# Patient Record
Sex: Female | Born: 1960 | Race: Black or African American | Hispanic: No | Marital: Married | Smoking: Former smoker
Health system: Southern US, Community
[De-identification: ages and names within clinical notes are randomized; demographics above are authoritative.]

## PROBLEM LIST (undated history)

## (undated) DIAGNOSIS — E119 Type 2 diabetes mellitus without complications: Secondary | ICD-10-CM

## (undated) DIAGNOSIS — E785 Hyperlipidemia, unspecified: Secondary | ICD-10-CM

## (undated) DIAGNOSIS — R011 Cardiac murmur, unspecified: Secondary | ICD-10-CM

## (undated) DIAGNOSIS — M1990 Unspecified osteoarthritis, unspecified site: Secondary | ICD-10-CM

## (undated) DIAGNOSIS — G4730 Sleep apnea, unspecified: Secondary | ICD-10-CM

## (undated) DIAGNOSIS — G589 Mononeuropathy, unspecified: Secondary | ICD-10-CM

## (undated) DIAGNOSIS — R87619 Unspecified abnormal cytological findings in specimens from cervix uteri: Secondary | ICD-10-CM

## (undated) DIAGNOSIS — IMO0002 Reserved for concepts with insufficient information to code with codable children: Secondary | ICD-10-CM

## (undated) DIAGNOSIS — Z87898 Personal history of other specified conditions: Secondary | ICD-10-CM

## (undated) HISTORY — DX: Hyperlipidemia, unspecified: E78.5

## (undated) HISTORY — PX: HIP SURGERY: SHX245

## (undated) HISTORY — PX: OTHER SURGICAL HISTORY: SHX169

## (undated) HISTORY — DX: Type 2 diabetes mellitus without complications: E11.9

## (undated) HISTORY — DX: Unspecified abnormal cytological findings in specimens from cervix uteri: R87.619

## (undated) HISTORY — PX: LAPAROSCOPIC GASTRIC BANDING: SHX1100

## (undated) HISTORY — DX: Reserved for concepts with insufficient information to code with codable children: IMO0002

## (undated) HISTORY — DX: Personal history of other specified conditions: Z87.898

---
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
## 1981-11-15 HISTORY — PX: TONSILLECTOMY: SUR1361

## 1998-02-13 ENCOUNTER — Ambulatory Visit (HOSPITAL_COMMUNITY): Admission: RE | Admit: 1998-02-13 | Discharge: 1998-02-13 | Payer: Self-pay | Admitting: Family Medicine

## 1998-02-18 ENCOUNTER — Other Ambulatory Visit: Admission: RE | Admit: 1998-02-18 | Discharge: 1998-02-18 | Payer: Self-pay | Admitting: Obstetrics & Gynecology

## 1998-08-15 ENCOUNTER — Other Ambulatory Visit: Admission: RE | Admit: 1998-08-15 | Discharge: 1998-08-15 | Payer: Self-pay | Admitting: Obstetrics & Gynecology

## 1999-03-15 ENCOUNTER — Inpatient Hospital Stay (HOSPITAL_COMMUNITY): Admission: AD | Admit: 1999-03-15 | Discharge: 1999-03-15 | Payer: Self-pay | Admitting: Obstetrics and Gynecology

## 1999-03-16 HISTORY — PX: DILATION AND CURETTAGE OF UTERUS: SHX78

## 1999-03-20 ENCOUNTER — Ambulatory Visit (HOSPITAL_COMMUNITY): Admission: AD | Admit: 1999-03-20 | Discharge: 1999-03-20 | Payer: Self-pay | Admitting: Obstetrics and Gynecology

## 1999-07-16 ENCOUNTER — Other Ambulatory Visit: Admission: RE | Admit: 1999-07-16 | Discharge: 1999-07-16 | Payer: Self-pay | Admitting: Obstetrics and Gynecology

## 1999-09-21 ENCOUNTER — Ambulatory Visit (HOSPITAL_COMMUNITY): Admission: RE | Admit: 1999-09-21 | Discharge: 1999-09-21 | Payer: Self-pay | Admitting: Obstetrics and Gynecology

## 1999-09-21 ENCOUNTER — Encounter: Payer: Self-pay | Admitting: Obstetrics and Gynecology

## 1999-11-18 ENCOUNTER — Encounter: Payer: Self-pay | Admitting: Obstetrics and Gynecology

## 1999-11-18 ENCOUNTER — Ambulatory Visit (HOSPITAL_COMMUNITY): Admission: RE | Admit: 1999-11-18 | Discharge: 1999-11-18 | Payer: Self-pay | Admitting: Obstetrics and Gynecology

## 2000-01-31 ENCOUNTER — Inpatient Hospital Stay (HOSPITAL_COMMUNITY): Admission: AD | Admit: 2000-01-31 | Discharge: 2000-02-02 | Payer: Self-pay | Admitting: Obstetrics & Gynecology

## 2000-02-03 ENCOUNTER — Encounter: Admission: RE | Admit: 2000-02-03 | Discharge: 2000-05-03 | Payer: Self-pay | Admitting: Obstetrics and Gynecology

## 2000-10-05 ENCOUNTER — Other Ambulatory Visit: Admission: RE | Admit: 2000-10-05 | Discharge: 2000-10-05 | Payer: Self-pay | Admitting: Obstetrics and Gynecology

## 2001-01-16 ENCOUNTER — Other Ambulatory Visit: Admission: RE | Admit: 2001-01-16 | Discharge: 2001-01-16 | Payer: Self-pay | Admitting: Obstetrics and Gynecology

## 2001-03-06 ENCOUNTER — Encounter: Payer: Self-pay | Admitting: Obstetrics and Gynecology

## 2001-03-06 ENCOUNTER — Ambulatory Visit (HOSPITAL_COMMUNITY): Admission: RE | Admit: 2001-03-06 | Discharge: 2001-03-06 | Payer: Self-pay | Admitting: Obstetrics and Gynecology

## 2001-06-13 ENCOUNTER — Encounter: Payer: Self-pay | Admitting: Obstetrics and Gynecology

## 2001-06-13 ENCOUNTER — Ambulatory Visit (HOSPITAL_COMMUNITY): Admission: RE | Admit: 2001-06-13 | Discharge: 2001-06-13 | Payer: Self-pay | Admitting: Obstetrics and Gynecology

## 2001-07-14 ENCOUNTER — Encounter: Payer: Self-pay | Admitting: Obstetrics and Gynecology

## 2001-07-14 ENCOUNTER — Ambulatory Visit (HOSPITAL_COMMUNITY): Admission: RE | Admit: 2001-07-14 | Discharge: 2001-07-14 | Payer: Self-pay | Admitting: Obstetrics and Gynecology

## 2001-07-27 ENCOUNTER — Inpatient Hospital Stay (HOSPITAL_COMMUNITY): Admission: AD | Admit: 2001-07-27 | Discharge: 2001-07-27 | Payer: Self-pay | Admitting: Obstetrics and Gynecology

## 2001-08-01 ENCOUNTER — Inpatient Hospital Stay (HOSPITAL_COMMUNITY): Admission: AD | Admit: 2001-08-01 | Discharge: 2001-08-03 | Payer: Self-pay | Admitting: Obstetrics and Gynecology

## 2001-09-07 ENCOUNTER — Other Ambulatory Visit: Admission: RE | Admit: 2001-09-07 | Discharge: 2001-09-07 | Payer: Self-pay | Admitting: Obstetrics and Gynecology

## 2002-03-11 ENCOUNTER — Encounter: Payer: Self-pay | Admitting: *Deleted

## 2002-03-11 ENCOUNTER — Emergency Department (HOSPITAL_COMMUNITY): Admission: EM | Admit: 2002-03-11 | Discharge: 2002-03-12 | Payer: Self-pay | Admitting: *Deleted

## 2002-10-29 ENCOUNTER — Other Ambulatory Visit: Admission: RE | Admit: 2002-10-29 | Discharge: 2002-10-29 | Payer: Self-pay | Admitting: Obstetrics and Gynecology

## 2002-11-19 ENCOUNTER — Encounter: Payer: Self-pay | Admitting: Obstetrics and Gynecology

## 2002-11-19 ENCOUNTER — Ambulatory Visit (HOSPITAL_COMMUNITY): Admission: RE | Admit: 2002-11-19 | Discharge: 2002-11-19 | Payer: Self-pay | Admitting: Obstetrics and Gynecology

## 2003-11-06 ENCOUNTER — Other Ambulatory Visit: Admission: RE | Admit: 2003-11-06 | Discharge: 2003-11-06 | Payer: Self-pay | Admitting: Obstetrics and Gynecology

## 2003-12-05 ENCOUNTER — Ambulatory Visit (HOSPITAL_COMMUNITY): Admission: RE | Admit: 2003-12-05 | Discharge: 2003-12-05 | Payer: Self-pay | Admitting: Obstetrics and Gynecology

## 2004-06-08 ENCOUNTER — Ambulatory Visit (HOSPITAL_COMMUNITY): Admission: RE | Admit: 2004-06-08 | Discharge: 2004-06-08 | Payer: Self-pay | Admitting: Surgery

## 2004-08-07 ENCOUNTER — Encounter: Admission: RE | Admit: 2004-08-07 | Discharge: 2004-11-05 | Payer: Self-pay | Admitting: Surgery

## 2004-09-15 ENCOUNTER — Observation Stay (HOSPITAL_COMMUNITY): Admission: RE | Admit: 2004-09-15 | Discharge: 2004-09-16 | Payer: Self-pay | Admitting: Surgery

## 2004-11-17 ENCOUNTER — Other Ambulatory Visit: Admission: RE | Admit: 2004-11-17 | Discharge: 2004-11-17 | Payer: Self-pay | Admitting: Obstetrics and Gynecology

## 2004-11-19 ENCOUNTER — Encounter: Admission: RE | Admit: 2004-11-19 | Discharge: 2005-02-17 | Payer: Self-pay | Admitting: Surgery

## 2004-12-07 ENCOUNTER — Ambulatory Visit (HOSPITAL_COMMUNITY): Admission: RE | Admit: 2004-12-07 | Discharge: 2004-12-07 | Payer: Self-pay | Admitting: Obstetrics and Gynecology

## 2005-02-24 ENCOUNTER — Encounter: Admission: RE | Admit: 2005-02-24 | Discharge: 2005-05-25 | Payer: Self-pay | Admitting: Surgery

## 2005-11-17 ENCOUNTER — Other Ambulatory Visit: Admission: RE | Admit: 2005-11-17 | Discharge: 2005-11-17 | Payer: Self-pay | Admitting: Obstetrics and Gynecology

## 2005-11-26 ENCOUNTER — Ambulatory Visit (HOSPITAL_COMMUNITY): Admission: RE | Admit: 2005-11-26 | Discharge: 2005-11-26 | Payer: Self-pay | Admitting: Obstetrics and Gynecology

## 2006-11-16 ENCOUNTER — Ambulatory Visit (HOSPITAL_COMMUNITY): Admission: RE | Admit: 2006-11-16 | Discharge: 2006-11-16 | Payer: Self-pay | Admitting: Obstetrics and Gynecology

## 2007-11-20 ENCOUNTER — Ambulatory Visit (HOSPITAL_COMMUNITY): Admission: RE | Admit: 2007-11-20 | Discharge: 2007-11-20 | Payer: Self-pay | Admitting: Obstetrics and Gynecology

## 2008-11-28 ENCOUNTER — Ambulatory Visit (HOSPITAL_COMMUNITY): Admission: RE | Admit: 2008-11-28 | Discharge: 2008-11-28 | Payer: Self-pay | Admitting: Obstetrics and Gynecology

## 2008-12-09 ENCOUNTER — Encounter: Admission: RE | Admit: 2008-12-09 | Discharge: 2008-12-09 | Payer: Self-pay | Admitting: Obstetrics and Gynecology

## 2009-11-28 ENCOUNTER — Ambulatory Visit (HOSPITAL_COMMUNITY): Admission: RE | Admit: 2009-11-28 | Discharge: 2009-11-28 | Payer: Self-pay | Admitting: Obstetrics and Gynecology

## 2010-01-19 ENCOUNTER — Encounter: Admission: RE | Admit: 2010-01-19 | Discharge: 2010-04-19 | Payer: Self-pay | Admitting: Surgery

## 2010-11-18 ENCOUNTER — Ambulatory Visit (HOSPITAL_COMMUNITY)
Admission: RE | Admit: 2010-11-18 | Discharge: 2010-11-18 | Payer: Self-pay | Source: Home / Self Care | Attending: Obstetrics and Gynecology | Admitting: Obstetrics and Gynecology

## 2010-12-05 ENCOUNTER — Encounter: Payer: Self-pay | Admitting: Surgery

## 2010-12-06 ENCOUNTER — Encounter: Payer: Self-pay | Admitting: Obstetrics and Gynecology

## 2011-04-02 NOTE — Op Note (Signed)
NAME:  Felicia Hayden, Felicia Hayden            ACCOUNT NO.:  [id number]   MEDICAL RECORD NO.:  [id number]          PATIENT TYPE:  AMB   LOCATION:  DAY                          FACILITY:  [facility]   PHYSICIAN:  Sandria Bales. Ezzard Standing, M.D.  DATE OF BIRTH:  [birth year]   DATE OF PROCEDURE:  09/15/2004  DATE OF DISCHARGE:                                 OPERATIVE REPORT   PREOPERATIVE DIAGNOSIS:  Morbid obesity with a weight of 238 pounds, a body  mass index of 38.   POSTOPERATIVE DIAGNOSIS:  Morbid obesity with a weight of 238 pounds, and a  body mass index of 38.   PROCEDURE:  Laparoscopic band placement.   SURGEON:  Sandria Bales. Ezzard Standing, M.D.   FIRST ASSISTANT:  Vikki Ports, MD   ANESTHESIA:  General endotracheal.   ESTIMATED BLOOD LOSS:  Minimal.   INDICATIONS FOR PROCEDURE:  Ms. Vasudevan is a 50 year old black female who  has been overweight much of her adult life.  She has tried multiple diets  without success.  She has been through a bariatric evaluation program and  has elected to proceed with a laparoscopic banding for her morbid obesity.  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  She understands the  indications and potential complications of the procedure.  The potential  complications include but are not limited to bleeding, infection,  perforation of the bowel, slippage of the band, the need for dietary changes  compliant with the band, and followup for band adjustments.   PROCEDURE:  Patient is placed in the supine position.  She was given 2 gm of  Ancef at the initiation of the procedure.  Her abdomen was prepped with  Betadine solution and sterilely draped.  I got into her abdominal cavity  using the Optiview through her left upper quadrant.  I then placed a 5 mm in  the lateral subcostal.  I placed a left paramedian 10 mm trocar, a right  paramedian 12 mm trocar.  This is where her band would go  through.  A 10 mm  right subcostal trocar, then a 5 mm subxiphoid for the [facility] retractor.   Abdominal exploration revealed right and left lobes of the liver are  unremarkable.  The anterior wall of the stomach is unremarkable.  Her  intestines are unremarkable.  She had a moderate amount of omentum.   Attention was turned towards the stomach.  First, I put in the [facility]  retractor.  This lifted the left lobe of the liver.  I identified a GE  junction and made a small incision at the angle of His.  I then came along  the lesser curvature, found the right crus, base at the right crus, and used  the finger dissector to pass this in the retroesophageal area to the angle  of His.   I visualized the finger through this and passed the lap band into the  abdominal cavity, passed it around the stomach, cinched it down on the lock  device.   I then placed three sutures which were gastrogastric sutures, which welled  over the lap band anteriorly.  I tried to avoid any contact with the  gastrogastric sutures with the bucket handle of the band, but the band  seemed to lay flat and in good position.  There was no bleeding.  I did  cinch it down over the gastric balloon that comes with the lap band to make  sure it was not too tight.  Then I used 2-0 Ethibond, three sutures, to  create gastrogastric sutures along the anterior aspect of the the stomach,  making sure it did not impinge on the bucket handle of the lap band.  I then  brought out through Silastic tubing through the 12 mm port to the right of  midline.   I then sewed in the reservoir using 2-0 Prolene sutures and attached the  reservoir with the band device.  This did then laid flat, pointing the  tubing to the left side of the abdomen.  The band lay flat against the  fascia.  I then closed the subcutaneous tissues with 0 Vicryl suture on the  skin at each site with 5-0 Monocryl.  All the ports were removed in turn, as  was the  [facility] retractor, without any bleeding from the port sites noted.  The skin site was closed with a 5-0 Monocryl suture.  Each wound was painted  with a tincture of Benzoin and Steri-Strips.   [The port lays 1 cm from the medial side of the port incision under the  scar.]   The patient tolerated the procedure well and was transported to the recovery  room in good condition.  Sponge and needle counts were correct at the end of  the case.     Davi   DHN/MEDQ  D:  09/15/2004  T:  09/15/2004  Job:  [id number]   cc:   Oley Balm. Georgina Pillion, M.D.  [address]  [place]  [place] [address]  Fax: [phone]

## 2011-04-02 NOTE — H&P (Signed)
[facility]  Patient:    Felicia Hayden, BLACKSON Visit Number: [id number] MRN: [id number]          Service Type: OBS Location: [facility] Attending Physician:  Shaune Spittle Dictated by:   Wynelle Bourgeois, CNM Admit Date:  07/27/2001 Discharge Date: 07/27/2001                           History and Physical  HISTORY OF PRESENT ILLNESS:   This is a 50 year old G4, para 1-0-2-1 at 39-6/7 weeks, who presents with complaints of regular uterine contractions every two to four minutes x 2 hours.  She denies any leaking or bleeding and reports positive fetal movement.  Pregnancy has been followed by Dr. Janine Limbo and has been remarkable for:  #1 - AMA, #2 - obesity, #3 - second pregnancy in one year, #4 - macrosomia.  PRENATAL LABORATORY DATA:     Hemoglobin 12.0, hematocrit 37.4, platelets 226,000.  Blood type A-positive, antibody screen negative.  RPR nonreactive. Rubella immune.  HBsAg negative.  HIV nonreactive.  Pap test:  Benign reactive changes.  Gonorrhea negative.  Chlamydia negative.  Glucose challenge within normal limits.  Group B strep positive.  OBSTETRICAL HISTORY:          Remarkable for spontaneous abortion in 1999 at [redacted] weeks gestation and spontaneous abortion in May of 2000 at [redacted] weeks gestation with a D&C, and a spontaneous vaginal delivery in March of 2001 of a female infant at [redacted] weeks gestation weighing 6 pounds 13 ounces.  MEDICAL HISTORY:              Remarkable for history of abnormal Paps in 1978 with normal ones since then, history of childhood varicella, history of one fibroid.  SURGICAL HISTORY:             Remarkable for tonsillectomy in 1983, wisdom teeth extraction in 1978 and D&C in 2000.  FAMILY HISTORY:               Remarkable for maternal grandmother with MI, father with hypertension and mother with anemia with B12 deficiency.  GENETIC HISTORY:              Remarkable for the patient being age 18  at delivery.  She declined amniocentesis.  SOCIAL HISTORY:               She is married to [place], who is involved and supportive.  She works as a Sports coach.  She denies any alcohol, tobacco or drug use.  She is of the [employer].  PHYSICAL EXAMINATION:  VITAL SIGNS:                  Stable.  Afebrile.  HEENT:                        Within normal limits.  NECK:                         Thyroid normal, not enlarged.  CHEST:                        Clear to auscultation bilaterally.  HEART:                        Heart rate regular rate and rhythm.  BREASTS:  Soft, nontender.  No masses.  ABDOMEN:                      Gravid at 42 cm, vertex to [place].  EFM shows reactive fetal heart rate tracing with positive accelerations and no decelerations and contractions every 2 to 3 minutes, lasting 60 seconds, which are strong.  PELVIC:                       Cervical exam is 1 to 2 cm, 75% effaced and -2 station.  Cervical exam several days ago was closed and high.  EXTREMITIES:                  Within normal limits with trace edema.  ASSESSMENT:                   1. Intrauterine pregnancy at 39-6/7 weeks.                               2. Early labor.                               3. Group B streptococcus positive.  PLAN:                         1. Admit to birthing suites per Dr. Stefano Gaul.                               2. Routine M.D. orders.                               3. Group B strep prophylaxis.                               4. Stadol p.r.n. Dictated by:   Wynelle Bourgeois, CNM Attending Physician:  Shaune Spittle DD:  08/01/01 TD:  08/01/01 Job: [id number]

## 2011-04-08 ENCOUNTER — Other Ambulatory Visit (HOSPITAL_COMMUNITY): Payer: Self-pay | Admitting: Obstetrics and Gynecology

## 2011-04-08 DIAGNOSIS — Z1231 Encounter for screening mammogram for malignant neoplasm of breast: Secondary | ICD-10-CM

## 2011-08-11 ENCOUNTER — Other Ambulatory Visit: Payer: Self-pay | Admitting: Family Medicine

## 2011-08-11 DIAGNOSIS — IMO0002 Reserved for concepts with insufficient information to code with codable children: Secondary | ICD-10-CM

## 2011-09-15 ENCOUNTER — Other Ambulatory Visit: Payer: Self-pay

## 2011-09-23 ENCOUNTER — Ambulatory Visit
Admission: RE | Admit: 2011-09-23 | Discharge: 2011-09-23 | Disposition: A | Discharge status: Home / Self Care | Payer: BC Managed Care – PPO | Source: Ambulatory Visit | Attending: Family Medicine | Admitting: Family Medicine

## 2011-09-23 DIAGNOSIS — IMO0002 Reserved for concepts with insufficient information to code with codable children: Secondary | ICD-10-CM

## 2011-09-27 ENCOUNTER — Other Ambulatory Visit: Payer: Self-pay | Admitting: Family Medicine

## 2011-09-27 DIAGNOSIS — IMO0002 Reserved for concepts with insufficient information to code with codable children: Secondary | ICD-10-CM

## 2011-10-13 ENCOUNTER — Other Ambulatory Visit: Payer: BC Managed Care – PPO

## 2011-10-16 ENCOUNTER — Other Ambulatory Visit: Payer: BC Managed Care – PPO

## 2011-11-16 DIAGNOSIS — D259 Leiomyoma of uterus, unspecified: Secondary | ICD-10-CM | POA: Insufficient documentation

## 2011-11-17 ENCOUNTER — Ambulatory Visit (HOSPITAL_COMMUNITY)
Admission: RE | Admit: 2011-11-17 | Discharge: 2011-11-17 | Disposition: A | Discharge status: Home / Self Care | Payer: BC Managed Care – PPO | Source: Ambulatory Visit | Attending: Obstetrics and Gynecology | Admitting: Obstetrics and Gynecology

## 2011-11-17 ENCOUNTER — Other Ambulatory Visit: Payer: BC Managed Care – PPO

## 2011-11-17 DIAGNOSIS — Z1231 Encounter for screening mammogram for malignant neoplasm of breast: Secondary | ICD-10-CM | POA: Insufficient documentation

## 2011-11-27 ENCOUNTER — Ambulatory Visit
Admission: RE | Admit: 2011-11-27 | Discharge: 2011-11-27 | Disposition: A | Discharge status: Home / Self Care | Payer: BC Managed Care – PPO | Source: Ambulatory Visit | Attending: Family Medicine | Admitting: Family Medicine

## 2011-11-27 ENCOUNTER — Other Ambulatory Visit: Payer: Self-pay | Admitting: Family Medicine

## 2011-11-27 DIAGNOSIS — IMO0002 Reserved for concepts with insufficient information to code with codable children: Secondary | ICD-10-CM

## 2011-12-01 ENCOUNTER — Encounter (INDEPENDENT_AMBULATORY_CARE_PROVIDER_SITE_OTHER): Payer: Self-pay | Admitting: Surgery

## 2012-04-17 ENCOUNTER — Encounter: Payer: Self-pay | Admitting: Obstetrics and Gynecology

## 2012-04-17 ENCOUNTER — Ambulatory Visit (INDEPENDENT_AMBULATORY_CARE_PROVIDER_SITE_OTHER): Payer: BC Managed Care – PPO | Admitting: Obstetrics and Gynecology

## 2012-04-17 VITALS — BP 130/80 | Temp 98.9°F | Ht 66.0 in | Wt 205.0 lb

## 2012-04-17 DIAGNOSIS — R87619 Unspecified abnormal cytological findings in specimens from cervix uteri: Secondary | ICD-10-CM

## 2012-04-17 DIAGNOSIS — E785 Hyperlipidemia, unspecified: Secondary | ICD-10-CM

## 2012-04-17 DIAGNOSIS — N920 Excessive and frequent menstruation with regular cycle: Secondary | ICD-10-CM

## 2012-04-17 LAB — POCT URINE PREGNANCY: Preg Test, Ur: NEGATIVE

## 2012-04-17 NOTE — Progress Notes (Signed)
51 YO on generic Yasmin reports regular menses until her menses was late in March and then it  became heavy.  Lasted 7 days with the use of 6 pads and accompanied by cramps rated as 3/10.  Typically flow only 3 days, 2 pads/day and no cramps. She was treated in March for a sinus infection with Augmentin and Prednisone and was thinking that this may have had something to do with her symptoms.  She had the same scenario in April & May but since May 15 the she has had brown spotting with small clots daily.  Denies pelvic pain, urinary tract symptoms, fever, nausea, vomiting or diarrhea. Patient's mother has a history of thyroid disease but the patient has had thyroid screenings that have been normal (most recent 05/2011).  O: Abdomen: soft, non-tender      PELVIC:  EGBUS-wnl, vagina with brown discharge,      cervix-no lesions, uterus- normal size without tender-      ness, adnexae-no masses or tenderness  UPT: negative  A: Unscheduled bleeding with BCPs  P: Pelvic ultrasound-rule out pelvic masses      RTO-ultrasound  Kiren Mcisaac, PA-C

## 2012-04-17 NOTE — Progress Notes (Signed)
When did bleeding start: MAY 15,2013 How  Long: SINCE THE ABOVE DATE How often changing pad/tampon: ONCE A DAY;MORE OF A D/C RATHER THAN BLDG. Bleeding Disorders: no Contraception: yes ZARAH Fibroids: no Hormone Therapy: no New Medications: yes IN MARCH PT TOOK PREDNISONE AND THAT IS WHEN BLDG. STARTED Menopausal Symptoms: no Vag. Discharge: yes Abdominal Pain: no Increased Stress: no

## 2012-05-03 ENCOUNTER — Ambulatory Visit (INDEPENDENT_AMBULATORY_CARE_PROVIDER_SITE_OTHER): Payer: BC Managed Care – PPO

## 2012-05-03 ENCOUNTER — Encounter: Payer: Self-pay | Admitting: Obstetrics and Gynecology

## 2012-05-03 ENCOUNTER — Ambulatory Visit (INDEPENDENT_AMBULATORY_CARE_PROVIDER_SITE_OTHER): Payer: BC Managed Care – PPO | Admitting: Obstetrics and Gynecology

## 2012-05-03 ENCOUNTER — Other Ambulatory Visit: Payer: Self-pay | Admitting: Obstetrics and Gynecology

## 2012-05-03 VITALS — BP 120/72 | Temp 98.5°F | Wt 199.0 lb

## 2012-05-03 DIAGNOSIS — N926 Irregular menstruation, unspecified: Secondary | ICD-10-CM

## 2012-05-03 DIAGNOSIS — Z8742 Personal history of other diseases of the female genital tract: Secondary | ICD-10-CM

## 2012-05-03 DIAGNOSIS — IMO0002 Reserved for concepts with insufficient information to code with codable children: Secondary | ICD-10-CM | POA: Insufficient documentation

## 2012-05-03 DIAGNOSIS — Z87898 Personal history of other specified conditions: Secondary | ICD-10-CM | POA: Insufficient documentation

## 2012-05-03 DIAGNOSIS — D219 Benign neoplasm of connective and other soft tissue, unspecified: Secondary | ICD-10-CM

## 2012-05-03 DIAGNOSIS — R6889 Other general symptoms and signs: Secondary | ICD-10-CM

## 2012-05-03 DIAGNOSIS — N920 Excessive and frequent menstruation with regular cycle: Secondary | ICD-10-CM

## 2012-05-03 DIAGNOSIS — D259 Leiomyoma of uterus, unspecified: Secondary | ICD-10-CM

## 2012-05-03 NOTE — Progress Notes (Signed)
51 YO seen previously for unscheduled bleeding on Yasmin (after taking prednisone) returns for ultrasound follow up.  Bleeding, though light, lasted for a month.   O:  U/S= uterus 9.80 x 4.60 x 4.28 cm, posterior left, LUS fibroid 4.5 cm x 4.6 cm x 3.9 cm;  endometrium 0.291 cm, ovaries appeared normal  A: Unscheduled Bleeding on BCPs (?related to prednisone)     Uterine Fibroid (4.5 cm)  P: Menstrual Calendar          Reviewed uterine fibroids, natural history, management     signs and symptoms-to observe for now      If symptoms continue, will consider a 35 mcg pill       RTO- AEx or prn

## 2012-10-16 ENCOUNTER — Other Ambulatory Visit: Payer: Self-pay | Admitting: Obstetrics and Gynecology

## 2012-10-16 DIAGNOSIS — Z1231 Encounter for screening mammogram for malignant neoplasm of breast: Secondary | ICD-10-CM

## 2012-11-16 ENCOUNTER — Encounter (INDEPENDENT_AMBULATORY_CARE_PROVIDER_SITE_OTHER): Payer: Self-pay | Admitting: General Surgery

## 2012-11-16 ENCOUNTER — Ambulatory Visit (HOSPITAL_COMMUNITY)
Admission: RE | Admit: 2012-11-16 | Discharge: 2012-11-16 | Disposition: A | Discharge status: Home / Self Care | Payer: BC Managed Care – PPO | Source: Ambulatory Visit | Attending: Obstetrics and Gynecology | Admitting: Obstetrics and Gynecology

## 2012-11-16 ENCOUNTER — Telehealth (INDEPENDENT_AMBULATORY_CARE_PROVIDER_SITE_OTHER): Payer: Self-pay | Admitting: General Surgery

## 2012-11-16 DIAGNOSIS — Z1231 Encounter for screening mammogram for malignant neoplasm of breast: Secondary | ICD-10-CM

## 2012-11-16 NOTE — Telephone Encounter (Signed)
Pt called for her yearly letter of medical necessity so insurance will help cover some of her expenses for protein supplement and MVI.  Letter prepared and awaits Dr. Allene Pyo signature.

## 2012-11-29 ENCOUNTER — Ambulatory Visit: Payer: BC Managed Care – PPO | Admitting: Obstetrics and Gynecology

## 2012-12-06 ENCOUNTER — Encounter: Payer: Self-pay | Admitting: Obstetrics and Gynecology

## 2012-12-06 ENCOUNTER — Ambulatory Visit: Payer: BC Managed Care – PPO | Admitting: Obstetrics and Gynecology

## 2012-12-06 VITALS — BP 110/72 | HR 72 | Resp 16 | Ht 66.0 in | Wt 191.0 lb

## 2012-12-06 DIAGNOSIS — Z124 Encounter for screening for malignant neoplasm of cervix: Secondary | ICD-10-CM

## 2012-12-06 DIAGNOSIS — Z113 Encounter for screening for infections with a predominantly sexual mode of transmission: Secondary | ICD-10-CM

## 2012-12-06 DIAGNOSIS — Z01419 Encounter for gynecological examination (general) (routine) without abnormal findings: Secondary | ICD-10-CM

## 2012-12-06 NOTE — Progress Notes (Signed)
The patient reports:normal menses, no abnormal bleeding, pelvic pain or discharge, no complaints  Contraception:oral contraceptives (estrogen/progesterone) Yasmin  Normal day 6 FSH last year  Last mammogram:11/2012 WNL Last pap: was normal and not applicable January  2013  GC/Chlamydia cultures offered: requested HIV/RPR/HbsAg offered:  requested HSV 1 and 2 glycoprotein offered: requested  Menstrual cycle regular and monthly: Yes Menstrual flow normal: Yes  Urinary symptoms: None Normal bowel movements: Yes Reports abuse at home: No:   Subjective:    Felicia Hayden is a 52 y.o. female, [phone], who presents for an annual exam.     History   Social History  . Marital Status: Married    Spouse Name: N/A    Number of Children: N/A  . Years of Education: N/A   Social History Main Topics  . Smoking status: Never Smoker   . Smokeless tobacco: Never Used  . Alcohol Use: No  . Drug Use: No  . Sexually Active: Yes    Birth Control/ Protection: Pill     Comment: zarah   Other Topics Concern  . None   Social History Narrative  . None    Menstrual cycle:   LMP: Patient's last menstrual period was 11/15/2012.           Cycle: normal  The following portions of the patient's history were reviewed and updated as appropriate: allergies, current medications, past family history, past medical history, past social history, past surgical history and problem list.  Review of Systems Pertinent items are noted in HPI. Breast:Negative for breast lump,nipple discharge or nipple retraction Gastrointestinal: Negative for abdominal pain, change in bowel habits or rectal bleeding Urinary:negative   Objective:    BP 110/72  Pulse 72  Resp 16  Ht 5\' 6"  (1.676 m)  Wt 191 lb (86.637 kg)  BMI 30.83 kg/m2  LMP 11/15/2012    Weight:  Wt Readings from Last 1 Encounters:  12/06/12 191 lb (86.637 kg)          BMI: Body mass index is 30.83 kg/(m^2).  General Appearance: Alert,  appropriate appearance for age. No acute distress HEENT: Grossly normal Neck / Thyroid: Supple, no masses, nodes or enlargement Lungs: clear to auscultation bilaterally Back: No CVA tenderness Breast Exam: No dimpling, nipple retraction or discharge. No masses or nodes. and No masses or nodes.No dimpling, nipple retraction or discharge. Cardiovascular: Regular rate and rhythm. S1, S2, no murmur Gastrointestinal: Soft, non-tender, no masses or organomegaly Pelvic Exam: Vulva and vagina appear normal. Bimanual exam reveals normal uterus and adnexa. Rectovaginal: normal rectal, no masses Lymphatic Exam: Non-palpable nodes in neck, clavicular, axillary, or inguinal regions  Skin: no rash or abnormalities Neurologic: Normal gait and speech, no tremor  Psychiatric: Alert and oriented, appropriate affect.   Assessment:    Normal gyn exam    Plan:    mammogram pap smear return annually or prn STD screening: declined Contraception:no method Day 6 FSH   Silverio Lay MD

## 2012-12-07 LAB — HIV ANTIBODY (ROUTINE TESTING W REFLEX): HIV: NONREACTIVE

## 2012-12-07 LAB — HSV 2 ANTIBODY, IGG: HSV 2 Glycoprotein G Ab, IgG: 0.18 IV

## 2012-12-07 LAB — HSV 1 ANTIBODY, IGG: HSV 1 Glycoprotein G Ab, IgG: 14.46 IV — ABNORMAL HIGH

## 2012-12-07 LAB — RPR

## 2012-12-07 LAB — HEPATITIS C ANTIBODY: HCV Ab: NEGATIVE

## 2012-12-07 LAB — PAP IG, CT-NG, RFX HPV ASCU: Chlamydia Probe Amp: NEGATIVE

## 2012-12-07 LAB — HEPATITIS B SURFACE ANTIGEN: Hepatitis B Surface Ag: NEGATIVE

## 2012-12-15 ENCOUNTER — Other Ambulatory Visit: Payer: Self-pay | Admitting: Obstetrics and Gynecology

## 2012-12-15 ENCOUNTER — Telehealth: Payer: Self-pay | Admitting: Obstetrics and Gynecology

## 2012-12-15 DIAGNOSIS — Z01419 Encounter for gynecological examination (general) (routine) without abnormal findings: Secondary | ICD-10-CM

## 2012-12-15 NOTE — Telephone Encounter (Signed)
Advised [facility] that pt is to have a day 6 FSH   Detrick Dani, [place], CMA

## 2012-12-18 NOTE — Progress Notes (Signed)
Quick Note:  Please inform Day 6 FSH is low which is C/W not menopause and needs to continue contraception. Suggest repeat Day 6 FSH in 6 months ______

## 2012-12-19 ENCOUNTER — Telehealth: Payer: Self-pay

## 2012-12-19 NOTE — Telephone Encounter (Signed)
LVM for pt to return call.   Akansha Wyche, CMA  

## 2012-12-19 NOTE — Telephone Encounter (Signed)
Pt advised and voiced understanding.  Also advised of abnormal HSV 1, pt voiced understanding   Darien Ramus, CMA

## 2012-12-19 NOTE — Telephone Encounter (Signed)
Message copied by Darien Ramus on Tue Dec 19, 2012 10:47 AM ------      Message from: Silverio Lay      Created: Mon Dec 18, 2012  6:19 PM       Please inform Day 6 FSH is low which is C/W not menopause and needs to continue contraception. Suggest repeat Day 6 FSH in 6 months

## 2012-12-20 ENCOUNTER — Other Ambulatory Visit: Payer: Self-pay | Admitting: Obstetrics and Gynecology

## 2012-12-30 ENCOUNTER — Other Ambulatory Visit: Payer: Self-pay

## 2013-01-02 ENCOUNTER — Other Ambulatory Visit: Payer: Self-pay

## 2013-01-02 DIAGNOSIS — Z309 Encounter for contraceptive management, unspecified: Secondary | ICD-10-CM

## 2013-01-02 MED ORDER — DROSPIRENONE-ETHINYL ESTRADIOL 3-0.03 MG PO TABS: 1.0000 | ORAL_TABLET | Freq: Every day | ORAL | Status: DC

## 2013-01-02 NOTE — Progress Notes (Signed)
rx refill for Zarah sent to [facility] Dr. Cora Collum, [place], CMA

## 2013-01-03 ENCOUNTER — Ambulatory Visit: Payer: BC Managed Care – PPO | Admitting: Obstetrics and Gynecology

## 2013-06-26 ENCOUNTER — Other Ambulatory Visit: Payer: Self-pay | Admitting: Obstetrics and Gynecology

## 2013-06-26 DIAGNOSIS — Z1231 Encounter for screening mammogram for malignant neoplasm of breast: Secondary | ICD-10-CM

## 2013-09-20 ENCOUNTER — Other Ambulatory Visit: Payer: Self-pay

## 2013-11-19 ENCOUNTER — Encounter (INDEPENDENT_AMBULATORY_CARE_PROVIDER_SITE_OTHER): Payer: Self-pay

## 2013-11-19 ENCOUNTER — Ambulatory Visit (HOSPITAL_COMMUNITY)
Admission: RE | Admit: 2013-11-19 | Discharge: 2013-11-19 | Disposition: A | Discharge status: Home / Self Care | Payer: BC Managed Care – PPO | Source: Ambulatory Visit | Attending: Obstetrics and Gynecology | Admitting: Obstetrics and Gynecology

## 2013-11-19 ENCOUNTER — Other Ambulatory Visit: Payer: Self-pay | Admitting: Obstetrics and Gynecology

## 2013-11-19 DIAGNOSIS — Z1231 Encounter for screening mammogram for malignant neoplasm of breast: Secondary | ICD-10-CM | POA: Insufficient documentation

## 2013-12-07 ENCOUNTER — Other Ambulatory Visit: Payer: Self-pay | Admitting: Obstetrics and Gynecology

## 2013-12-07 DIAGNOSIS — N6310 Unspecified lump in the right breast, unspecified quadrant: Secondary | ICD-10-CM

## 2013-12-17 ENCOUNTER — Other Ambulatory Visit: Payer: Self-pay

## 2013-12-17 ENCOUNTER — Other Ambulatory Visit: Payer: Self-pay | Admitting: Obstetrics and Gynecology

## 2013-12-17 DIAGNOSIS — N6310 Unspecified lump in the right breast, unspecified quadrant: Secondary | ICD-10-CM

## 2013-12-21 ENCOUNTER — Ambulatory Visit
Admission: RE | Admit: 2013-12-21 | Discharge: 2013-12-21 | Disposition: A | Discharge status: Home / Self Care | Payer: BC Managed Care – PPO | Source: Ambulatory Visit | Attending: Obstetrics and Gynecology | Admitting: Obstetrics and Gynecology

## 2013-12-21 ENCOUNTER — Other Ambulatory Visit: Payer: BC Managed Care – PPO

## 2013-12-21 DIAGNOSIS — N6310 Unspecified lump in the right breast, unspecified quadrant: Secondary | ICD-10-CM

## 2014-07-11 ENCOUNTER — Ambulatory Visit
Admission: RE | Admit: 2014-07-11 | Discharge: 2014-07-11 | Disposition: A | Discharge status: Home / Self Care | Payer: BC Managed Care – PPO | Source: Ambulatory Visit | Attending: Family Medicine | Admitting: Family Medicine

## 2014-07-11 ENCOUNTER — Other Ambulatory Visit: Payer: Self-pay | Admitting: Family Medicine

## 2014-07-11 DIAGNOSIS — M25551 Pain in right hip: Secondary | ICD-10-CM

## 2014-09-16 ENCOUNTER — Encounter: Payer: Self-pay | Admitting: Obstetrics and Gynecology

## 2014-10-14 ENCOUNTER — Other Ambulatory Visit (HOSPITAL_COMMUNITY): Payer: Self-pay | Admitting: Obstetrics and Gynecology

## 2014-10-14 DIAGNOSIS — Z1231 Encounter for screening mammogram for malignant neoplasm of breast: Secondary | ICD-10-CM

## 2014-11-18 ENCOUNTER — Ambulatory Visit (HOSPITAL_COMMUNITY): Payer: BC Managed Care – PPO

## 2014-11-20 ENCOUNTER — Ambulatory Visit (HOSPITAL_COMMUNITY): Payer: BC Managed Care – PPO

## 2014-11-25 ENCOUNTER — Ambulatory Visit (HOSPITAL_COMMUNITY)
Admission: RE | Admit: 2014-11-25 | Discharge: 2014-11-25 | Disposition: A | Discharge status: Home / Self Care | Payer: BC Managed Care – PPO | Source: Ambulatory Visit | Attending: Obstetrics and Gynecology | Admitting: Obstetrics and Gynecology

## 2014-11-25 ENCOUNTER — Ambulatory Visit (HOSPITAL_COMMUNITY): Payer: BC Managed Care – PPO

## 2014-11-25 DIAGNOSIS — Z1231 Encounter for screening mammogram for malignant neoplasm of breast: Secondary | ICD-10-CM | POA: Diagnosis present

## 2014-12-20 ENCOUNTER — Encounter (HOSPITAL_COMMUNITY): Payer: Self-pay | Admitting: Emergency Medicine

## 2014-12-20 ENCOUNTER — Emergency Department (HOSPITAL_COMMUNITY)
Admission: EM | Admit: 2014-12-20 | Discharge: 2014-12-20 | Disposition: A | Discharge status: Home / Self Care | Payer: BC Managed Care – PPO | Attending: Emergency Medicine | Admitting: Emergency Medicine

## 2014-12-20 DIAGNOSIS — Z791 Long term (current) use of non-steroidal anti-inflammatories (NSAID): Secondary | ICD-10-CM | POA: Diagnosis not present

## 2014-12-20 DIAGNOSIS — Z79899 Other long term (current) drug therapy: Secondary | ICD-10-CM | POA: Insufficient documentation

## 2014-12-20 DIAGNOSIS — Z7951 Long term (current) use of inhaled steroids: Secondary | ICD-10-CM | POA: Diagnosis not present

## 2014-12-20 DIAGNOSIS — M79671 Pain in right foot: Secondary | ICD-10-CM

## 2014-12-20 DIAGNOSIS — E119 Type 2 diabetes mellitus without complications: Secondary | ICD-10-CM | POA: Diagnosis not present

## 2014-12-20 DIAGNOSIS — Z8742 Personal history of other diseases of the female genital tract: Secondary | ICD-10-CM | POA: Diagnosis not present

## 2014-12-20 MED ORDER — TRAMADOL HCL 50 MG PO TABS: 100.0000 mg | ORAL_TABLET | Freq: Four times a day (QID) | ORAL | Status: DC | PRN

## 2014-12-20 MED ORDER — IBUPROFEN 800 MG PO TABS: 800.0000 mg | ORAL_TABLET | Freq: Three times a day (TID) | ORAL | Status: DC

## 2014-12-20 MED ORDER — ENOXAPARIN SODIUM 100 MG/ML ~~LOC~~ SOLN
1.0000 mg/kg | Freq: Once | SUBCUTANEOUS | Status: AC
  Administered 2014-12-20: 90 mg via SUBCUTANEOUS
  Filled 2014-12-20: qty 1

## 2014-12-20 NOTE — ED Notes (Signed)
Pt arrived to the ED having been sent by her PCP for an elevated D-dimer who's value was 2.79.  Pt has had pain in her right foot since Thursday.   Pt states her foot was red and warm.  PCP sent patient to get an ultra sound.

## 2014-12-20 NOTE — ED Notes (Signed)
Patient sent by PCP for [place] of right lower extremity Patient states that PCP informed her of elevated D-Dimer Patient ambulatory from triage without difficulty Patient denies SOB, CP Patient in NAD

## 2014-12-20 NOTE — ED Notes (Signed)
Per ED secretary, patient to be given phone number for vascular department  Patient to call number tomorrow morning to schedule appointment for PVL testing

## 2014-12-20 NOTE — ED Notes (Signed)
Patient informed to call the Vascular Lab at [phone] tomorrow morning after 0800 to schedule test Patient agrees and v/u

## 2014-12-20 NOTE — ED Notes (Signed)
Awaiting verification of Lovenox by Pharmacy

## 2014-12-20 NOTE — ED Notes (Signed)
Patient reports that "positive D-dimer" was drawn at 1500 this afternoon There are no lab results in EPIC at this time Will d/w EDP if repeat labs are needed

## 2014-12-20 NOTE — ED Notes (Signed)
MD at bedside. 

## 2014-12-20 NOTE — ED Notes (Signed)
Medication verified Awaiting arrival to ED

## 2014-12-20 NOTE — ED Notes (Signed)

## 2014-12-20 NOTE — ED Provider Notes (Signed)
CSN: [id number]     Arrival date & time 12/20/14  2048 History   First MD Initiated Contact with Patient 12/20/14 2232     Chief Complaint  Patient presents with  . Foot Pain     (Consider location/radiation/quality/duration/timing/severity/associated sxs/prior Treatment) HPI The patient reports that she developed some pain in her right inner ankle 2 days ago. She reports that it is tender to the touch and is somewhat exacerbated by full weightbearing. She denies any injury. She denies any pain in her calf or behind her knee. She has no history of blood clot. She has no associated chest pain or shortness of breath. The patient denies having recently changed shoes or activities. She saw her family doctor for evaluation today and at that point her physician decided to do a d-dimer. The d-dimer returned positive and the patient was instructed to come to the emergency department for ultrasound. Past Medical History  Diagnosis Date  . Hyperlipidemia   . Abnormal Pap smear   . History of abnormal Pap smear 1978    S/P cryotherapy  . Diabetes mellitus without complication    Past Surgical History  Procedure Laterality Date  . Laparoscopic gastric banding    . Dilation and curettage of uterus  03/1999  . Tonsillectomy  1983   Family History  Problem Relation Age of Onset  . Hypertension Mother   . Cancer Mother     ovarian; uterine  . Ovarian cancer Mother   . Thyroid disease Mother   . Hypothyroidism Mother   . Cancer Sister     skin  . Cancer Sister     lung  . Heart attack Maternal Grandmother   . Pneumonia Father    History  Substance Use Topics  . Smoking status: Never Smoker   . Smokeless tobacco: Never Used  . Alcohol Use: No   OB History    Gravida Para Term Preterm AB TAB SAB Ectopic Multiple Living   4 2   2  2   2      Review of Systems  10 Systems reviewed and are negative for acute change except as noted in the HPI.   Allergies  Review of patient's  allergies indicates no known allergies.  Home Medications   Prior to Admission medications   Medication Sig Start Date End Date Taking? Authorizing Provider  calcium-vitamin D (OSCAL WITH D) 500-200 MG-UNIT per tablet Take 1 tablet by mouth.   Yes Historical Provider, MD  drospirenone-ethinyl estradiol (ZARAH) 3-0.03 MG tablet Take 1 tablet by mouth daily. 01/02/13  Yes Delsa Bern, MD  fluticasone (FLONASE) 50 MCG/ACT nasal spray Place 2 sprays into the nose daily.   Yes Historical Provider, MD  levocetirizine (XYZAL) 5 MG tablet Take 5 mg by mouth every evening.   Yes Historical Provider, MD  loratadine (CLARITIN) 10 MG tablet Take 10 mg by mouth daily.   Yes Historical Provider, MD  Meloxicam 7.5 MG/5ML SUSP Take by mouth.   Yes Historical Provider, MD  metFORMIN (GLUCOPHAGE) 500 MG tablet Take 500 mg by mouth 2 (two) times daily with a meal.    Yes Historical Provider, MD  Multiple Vitamin (MULTIVITAMIN WITH MINERALS) TABS tablet Take 1 tablet by mouth daily.   Yes Historical Provider, MD  OVER THE COUNTER MEDICATION Take 1 tablet by mouth daily. Vitamin d 200 unit   Yes Historical Provider, MD  ibuprofen (ADVIL,MOTRIN) 800 MG tablet Take 1 tablet (800 mg total) by mouth 3 (three) times daily. 12/20/14  Charlesetta Shanks, MD  pseudoephedrine (SUDAFED) 30 MG tablet Take 30 mg by mouth every 4 (four) hours as needed for congestion.     Historical Provider, MD  traMADol (ULTRAM) 50 MG tablet Take 2 tablets (100 mg total) by mouth every 6 (six) hours as needed. 12/20/14   Charlesetta Shanks, MD   BP 144/85 mmHg  Pulse 82  Temp(Src) 98.1 F (36.7 C) (Oral)  Resp 18  Ht 5\' 6"  (1.676 m)  Wt 197 lb (89.359 kg)  BMI 31.81 kg/m2  SpO2 100%  LMP 12/04/2014 (Exact Date) Physical Exam  Constitutional: She is oriented to person, place, and time. She appears well-developed and well-nourished.  HENT:  Head: Normocephalic and atraumatic.  Eyes: EOM are normal. Pupils are equal, round, and reactive to  light.  Neck: Neck supple.  Cardiovascular: Normal rate, regular rhythm, normal heart sounds and intact distal pulses.   Pulmonary/Chest: Effort normal and breath sounds normal.  Abdominal: Soft. Bowel sounds are normal. She exhibits no distension. There is no tenderness.  Musculoskeletal: Normal range of motion. She exhibits no edema.  The patient has a faint area of erythema on the medial ankle which is just posterior to the medial malleolus and anterior to the Achilles. This area is slightly tender. There is no tenderness over the malleolus or the Achilles tendon itself. Pressure into the sole of the foot does not cause pain. The condition of the foot is excellent without any wounds or swelling. The calf is soft and nontender. The Pap until fossa is nontender. I do appreciate a very mild erythema appearance on the medial aspect of the knee. The patient does not perceive any pain in this area to palpation nor any associated knee pain. The only pain that she appreciates is localized to this one area on the medial ankle. The patient's vascular and neurologic examination is normal.  Neurological: She is alert and oriented to person, place, and time. She has normal strength. Coordination normal. GCS eye subscore is 4. GCS verbal subscore is 5. GCS motor subscore is 6.  Skin: Skin is warm, dry and intact.  Psychiatric: She has a normal mood and affect.    ED Course  Procedures (including critical care time) Labs Review Labs Reviewed - No data to display  Imaging Review No results found.   EKG Interpretation None      MDM   Final diagnoses:  Foot pain, right   The patient was sent to the emergency department for duplex venous study per her report. Her physical examination is not highly suggestive of DVT however her primary provider had obtained a elevated d-dimer. She has no other positive symptoms. At this time as the duplex study is not available, the patient will be treated empirically  with Lovenox with a plan for getting the study done in the morning.    Charlesetta Shanks, MD 12/20/14 [phone]

## 2014-12-20 NOTE — Discharge Instructions (Signed)
Evaluation for Venous Thromboembolism Venous thromboembolism (VTE) is a condition where a blood clot (thrombus) develops in the body. A thrombus usually occurs in a deep vein in the leg or pelvis but can occur in an upper extremity. Sometimes pieces of the thrombus can break off from its original place of development and travel through the bloodstream to other parts of the body. When a thrombus breaks off and travels through the bloodstream, it is called an embolism. The embolism can block the blood flow in the blood vessels of other organs. There are two serious types of VTE:  Deep vein thrombosis (DVT). A DVT is a thrombus that usually occurs in a deep vein of the lower legs, pelvis, or in an upper extremity.  Pulmonary embolism (PE). A PE occurs when an embolism has formed and traveled to the lungs. A PE can block or decrease the blood flow in one or both lungs. Venous thromboembolism is a serious health condition that can cause disability or death. It is very important to not ignore symptoms or delay treatment.   CAUSES   A blood clot can form in a vein from different conditions. A blood clot can develop due to:  Blood flow within a vein that is sluggish or very slow.  Medical conditions that make the blood clot easily.  Vein damage. RISK FACTORS Risk factors can increase your risk of developing a blood clot. Risk factors can include:  Smoking.  Obesity.  Age.  Immobility or sedentary lifestyle.  Sitting or standing for long periods of time.  Chronic or long-term bedrest.  Medical or past history of blood clots.  Family history of blood clots.  Hip, leg, or pelvis injury or trauma.  Major surgery, especially surgery on the hip, knee, or abdomen.  Pregnancy and childbirth.  Birth control pills and hormone replacement therapy.  Medical conditions such as  Peripheral vascular disease (PVD).  Diabetes.  Cancer. SIGNS AND SYMPTOMS  Symptoms of VTE can depend on  where the clot is located and if the clot breaks off and travels to another organ. Sometimes, there may be no symptoms.   DVT symptoms can include:  Swelling of the leg or arm, especially on one side.  Warmth and redness of the leg or arm, especially on one side.  Pain in an arm or leg. Leg pain may be more noticeable or worse when standing or walking.  PE symptoms can include:  Shortness of breath.  Coughing.  Coughing up blood or blood-tinged mucus (hemoptysis).  Chest pain or chest pain with deep breaths (pleuritic chest pain).  Apprehension, anxiety, or a feeling of impending doom.  Rapid heartbeat. A PE is a medical emergency. Call your local emergency services (911 in [place] if you have these symptoms. DIAGNOSIS  A venous thromboembolism is diagnosed by:  Looking at your medical history and risk factors. Your health care provider will perform a physical exam.  Blood tests, including blood work of how your blood clots.  Imaging tests that can detect a blood clot may be ordered. These can include:  Ultrasonography.  Computed Tomography (CT) scan.  Magnetic Resonance Imaging (MRI).  Echocardiography.  Electrocardiography. TREATMENT  Initial treatment: When a venous thromboembolism has been confirmed, initial treatment consists of using blood-thinning (anticoagulant) medicines. Anticoagulant medicines affect how your blood clots and can cause bleeding. Therefore, when you are on anticoagulants, your blood clotting times are monitored by blood tests called prothrombin time (PT) and International Normalized Ratio (INR). Typically, the anticoagulants are  intravenous (IV) heparin and warfarin. IV heparin is normally started right away because it has a rapid onset of action and thins the blood quickly. Warfarin is also started with IV heparin therapy. Warfarin has a slower onset of action and takes longer to work. This overlap of IV heparin and oral warfarin therapy is  continued until PT and INR levels are therapeutic. After the PT and INR levels are therapeutic, IV heparin is discontinued and you are maintained on warfarin.  Other treatment options:  Catheter-directed thrombolysis. This is a clot-busting therapy for a DVT in which a small, flexible hollow tube (catheter) is threaded to the blood clot inside the vein. A clot-busting drug (thrombolytic) is then infused through the catheter. The thrombolytic helps to break up the clot in the vein and restore blood flow.  Direct thrombin inhibitor (DTI) medicine. A DTI is an anticoagulant that slows blood clotting. It is given through an IV.  If you cannot take an anticoagulant, a filter called an inferior vena cava filter (IVC filter) can be placed. The IVC filter is placed in a large vein, in either your leg or abdomen. An IVC filter is left in the vein permanently. The IVC filter can help prevent blood clots from going to your lungs.  Surgery. Blood clots may need to be removed surgically if other treatment options are not working or cannot be used. Types of surgery can include:  Thrombectomy.  Embolectomy.  Venous stenting.  Pain medicine (analgesic). Medicine to control pain is given in addition to the above treatment options. Home treatment:  Continued treatment at home consists of taking either warfarin or under-the-skin (subcutaneous) injections of an anticoagulant. HOME CARE INSTRUCTIONS   Take medicines only as directed by your health care provider. Follow the directions carefully.  Warfarin. Most people will continue taking warfarin. Your health care provider will advise you on the length of treatment (usually 3 to 6 months, sometimes lifelong).  Too much and too little warfarin are both dangerous. Too much warfarin increases the risk of bleeding. Too little warfarin continues to allow the risk for blood clots. While taking warfarin, you will need to have regular blood tests to measure your blood  clotting time. These blood tests usually include both the PT and INR tests. The PT and INR results allow your health care provider to adjust your dose of warfarin. The dose can change for many reasons. It is critically important that you take warfarin exactly as prescribed, and that you have your PT and INR levels drawn exactly as directed.  Many foods, especially foods high in vitamin K can interfere with warfarin and affect the PT and INR results. Foods high in vitamin K include spinach, kale, broccoli, cabbage, collard and turnip greens, Brussels sprouts, peas, cauliflower, seaweed, and parsley as well as beef and pork liver, green tea, and soybean oil. You should eat a consistent amount of foods high in vitamin K. Avoid major changes in your diet, or notify your health care provider before changing your diet. Arrange a visit with a dietitian to answer your questions.  Many medicines can interfere with warfarin and affect the PT and INR results. You must tell your health care provider about any and all medicines you take, including all vitamins and supplements. Be especially cautious with aspirin and anti-inflammatory medicines. Do not take or discontinue any prescribed or over-the-counter medicine except on the advice of your health care provider or pharmacist.  Warfarin can have side effects, such as excessive bruising  or bleeding. You will need to hold pressure over cuts for longer than usual. Your health care provider or pharmacist will discuss other potential side effects.  Avoid sports or activities that may cause injury or bleeding.  Be mindful when shaving, flossing your teeth, or handling sharp objects.  Alcohol can change the body's ability to handle warfarin. It is best to avoid alcoholic drinks or consume only very small amounts while taking warfarin. Notify your health care provider if you change your alcohol intake.  Notify your dentist or other health care providers before  procedures.  Activity. Ask your health care provider how soon you can go back to normal activities if you have had a blood clot.  Exercise. It is very important to exercise and stay active to prevent future blood clots. This is especially important while traveling, sitting, or standing for long periods of time. Exercise your legs by walking or by pumping the muscles frequently.  Compression stockings. You may need to wear compression stockings to help prevent a DVT.  Smoking. If you smoke, quit. Ask your health care provider for help with quitting smoking.  Learn as much as you can about VTE. Educating yourself can help prevent VTE from reoccurring.  Wear a medical alert bracelet or carry a medical alert card. PREVENTION   In-hospital prevention:  Activity. Getting out of bed and walking while you are in the hospital can help prevent blood clots.  Medicines may be given to help prevent blood clots.  Sequential compression device (SCD). A SCD can help prevent blood clots in the lower legs. A compression sleeve is wrapped around each of your legs. The tubing of the sleeve is connected to a machine that pumps air into the compression sleeve. The pumping action of the sleeve helps circulate the blood in your legs.  Compression stockings. Compression stockings are tight, elastic stockings that apply pressure to the lower legs and help prevent blood from pooling in the lower legs. Compression stockings are sometimes used with SCDs.  General prevention:  Exercise regularly if you are able.  Avoid sitting or lying in bed for long periods of time without moving the legs.  Do not smoke. If you smoke, quit. Ask your health care provider for help.  Avoid exposure to smoke.  Maintain a healthy weight.  Women over the age of 27 should consider the risk of blood clots while taking birth control pills or hormone replacement therapy.  Long distance travel along with prolonged sitting and  standing can increase the risk of a DVT. Exercise your legs by walking or by pumping your leg muscles every hour. SEEK MEDICAL CARE IF:   You feel faint or dizzy.  You feel rapid or skipped heartbeats.  You feel weaker or more tired than usual.  You feel you are not getting better in the time expected.  You believe you are having side effects from medicine.  You have joint pain.  You have abdominal pain.  You have new or increased bruising. SEEK IMMEDIATE MEDICAL CARE IF:   You vomit bright red blood or your vomit has a coffee ground type appearance.  You have bowel movements that have bright red blood or are dark or tarry in appearance.  You have bleeding from your rectum.  You have pink or bloody urine.  You develop breathing problems such as shortness of breath or pain with breathing.  You are coughing up blood.  You develop warmth, swelling, or redness in an arm or a  leg.  You have chest pain.  You have a sudden, unexplained severe headache.  You have a cut that does not stop bleeding after 10 minutes.  You have a nosebleed that does not stop bleeding after 10 minutes. Document Released: 08/29/2009 Document Revised: 03/18/2014 Document Reviewed: 04/12/2012 [facility] Patient Information 2015 [place], [place]. This information is not intended to replace advice given to you by your health care provider. Make sure you discuss any questions you have with your health care provider.

## 2014-12-21 ENCOUNTER — Ambulatory Visit (HOSPITAL_COMMUNITY)
Admission: RE | Admit: 2014-12-21 | Discharge: 2014-12-21 | Disposition: A | Discharge status: Home / Self Care | Payer: BC Managed Care – PPO | Source: Ambulatory Visit | Attending: Emergency Medicine | Admitting: Emergency Medicine

## 2014-12-21 DIAGNOSIS — M79604 Pain in right leg: Secondary | ICD-10-CM | POA: Diagnosis present

## 2014-12-21 DIAGNOSIS — M79609 Pain in unspecified limb: Secondary | ICD-10-CM

## 2014-12-21 DIAGNOSIS — M79605 Pain in left leg: Secondary | ICD-10-CM | POA: Diagnosis not present

## 2014-12-21 NOTE — Progress Notes (Signed)
VASCULAR LAB PRELIMINARY  PRELIMINARY  PRELIMINARY  PRELIMINARY  Bilateral lower extremity venous Dopplers completed.    Preliminary report:  There is no DVT or SVT noted in the bilateral lower extremities.   Kipper Buch, RVT 12/21/2014, 9:43 AM

## 2015-01-24 ENCOUNTER — Encounter (INDEPENDENT_AMBULATORY_CARE_PROVIDER_SITE_OTHER): Payer: Self-pay | Admitting: Surgery

## 2015-11-27 ENCOUNTER — Other Ambulatory Visit: Payer: Self-pay

## 2015-11-27 DIAGNOSIS — Z1231 Encounter for screening mammogram for malignant neoplasm of breast: Secondary | ICD-10-CM

## 2015-12-29 ENCOUNTER — Ambulatory Visit
Admission: RE | Admit: 2015-12-29 | Discharge: 2015-12-29 | Disposition: A | Discharge status: Home / Self Care | Payer: BC Managed Care – PPO | Source: Ambulatory Visit

## 2015-12-29 DIAGNOSIS — Z1231 Encounter for screening mammogram for malignant neoplasm of breast: Secondary | ICD-10-CM

## 2016-07-05 ENCOUNTER — Inpatient Hospital Stay (HOSPITAL_COMMUNITY): Admission: RE | Admit: 2016-07-05 | Payer: BC Managed Care – PPO | Source: Ambulatory Visit

## 2016-07-15 ENCOUNTER — Encounter (HOSPITAL_COMMUNITY): Admission: RE | Payer: Self-pay | Source: Ambulatory Visit

## 2016-07-15 ENCOUNTER — Ambulatory Visit (HOSPITAL_COMMUNITY)
Admission: RE | Admit: 2016-07-15 | Payer: BC Managed Care – PPO | Source: Ambulatory Visit | Admitting: Obstetrics and Gynecology

## 2016-07-15 SURGERY — DILATATION & CURETTAGE/HYSTEROSCOPY WITH RESECTOCOPE
Anesthesia: General

## 2016-11-18 ENCOUNTER — Other Ambulatory Visit: Payer: Self-pay | Admitting: Obstetrics and Gynecology

## 2016-11-18 DIAGNOSIS — Z1231 Encounter for screening mammogram for malignant neoplasm of breast: Secondary | ICD-10-CM

## 2016-12-17 ENCOUNTER — Ambulatory Visit
Admission: RE | Admit: 2016-12-17 | Discharge: 2016-12-17 | Disposition: A | Discharge status: Home / Self Care | Payer: BC Managed Care – PPO | Source: Ambulatory Visit | Attending: Obstetrics and Gynecology | Admitting: Obstetrics and Gynecology

## 2016-12-17 DIAGNOSIS — Z1231 Encounter for screening mammogram for malignant neoplasm of breast: Secondary | ICD-10-CM

## 2017-06-07 ENCOUNTER — Encounter: Payer: Self-pay | Admitting: Endocrinology

## 2017-08-29 ENCOUNTER — Encounter: Payer: Self-pay | Admitting: Endocrinology

## 2017-08-29 ENCOUNTER — Ambulatory Visit (INDEPENDENT_AMBULATORY_CARE_PROVIDER_SITE_OTHER): Payer: BC Managed Care – PPO | Admitting: Endocrinology

## 2017-08-29 DIAGNOSIS — Z794 Long term (current) use of insulin: Secondary | ICD-10-CM | POA: Diagnosis not present

## 2017-08-29 DIAGNOSIS — E119 Type 2 diabetes mellitus without complications: Secondary | ICD-10-CM | POA: Diagnosis not present

## 2017-08-29 LAB — POCT GLYCOSYLATED HEMOGLOBIN (HGB A1C): Hemoglobin A1C: 10

## 2017-08-29 MED ORDER — DULAGLUTIDE 1.5 MG/0.5ML ~~LOC~~ SOAJ: 1.5000 mg | SUBCUTANEOUS | 3 refills | Status: DC

## 2017-08-29 MED ORDER — DULAGLUTIDE 1.5 MG/0.5ML ~~LOC~~ SOAJ: 1.5000 mg | SUBCUTANEOUS | 11 refills | Status: DC

## 2017-08-29 MED ORDER — INSULIN DETEMIR 100 UNIT/ML FLEXPEN
50.0000 [IU] | PEN_INJECTOR | Freq: Every day | SUBCUTANEOUS | 3 refills | Status: DC
Start: 2017-08-29 — End: 2017-12-02

## 2017-08-29 MED ORDER — INSULIN DETEMIR 100 UNIT/ML FLEXPEN: 50.0000 [IU] | PEN_INJECTOR | Freq: Every day | SUBCUTANEOUS | 11 refills | Status: DC

## 2017-08-29 NOTE — Progress Notes (Signed)
Subjective:    Patient ID: Felicia Hayden, female    DOB: [birth year], 56 y.o.   MRN: [id number]  HPI pt is referred by Dr Stephanie Acre, for diabetes.  Pt states DM was dx'ed in 2013; she has mild if any neuropathy of the lower extremities; she is unaware of any associated chronic complications; she has been on insulin since soon after dx; pt says her diet and exercise are poor; she has never had GDM, pancreatitis, pancreatic surgery, severe hypoglycemia or DKA.  She takes levemir, 30 units 30 units QHS, glucovance, and victoza.  She says cbg's are approx 90-100 fasting, which is when she checks.   Past Medical History:  Diagnosis Date  . Abnormal Pap smear   . Diabetes mellitus without complication ([place])   . History of abnormal Pap smear 1978   S/P cryotherapy  . Hyperlipidemia     Past Surgical History:  Procedure Laterality Date  . DILATION AND CURETTAGE OF UTERUS  03/1999  . LAPAROSCOPIC GASTRIC BANDING    . TONSILLECTOMY  1983    Social History   Social History  . Marital status: Married    Spouse name: N/A  . Number of children: N/A  . Years of education: N/A   Occupational History  . Not on file.   Social History Main Topics  . Smoking status: Never Smoker  . Smokeless tobacco: Never Used  . Alcohol use No  . Drug use: No  . Sexual activity: Yes    Birth control/ protection: Pill     Comment: zarah   Other Topics Concern  . Not on file   Social History Narrative  . No narrative on file    Current Outpatient Prescriptions on File Prior to Visit  Medication Sig Dispense Refill  . calcium-vitamin D (OSCAL WITH D) 500-200 MG-UNIT per tablet Take 1 tablet by mouth.    . drospirenone-ethinyl estradiol (ZARAH) 3-0.03 MG tablet Take 1 tablet by mouth daily. 1 Package 11  . fluticasone (FLONASE) 50 MCG/ACT nasal spray Place 2 sprays into the nose daily.    Marland Kitchen levocetirizine (XYZAL) 5 MG tablet Take 5 mg by mouth every evening.    . loratadine (CLARITIN) 10 MG tablet  Take 10 mg by mouth daily.    . Meloxicam 7.5 MG/5ML SUSP Take by mouth.    . Multiple Vitamin (MULTIVITAMIN WITH MINERALS) TABS tablet Take 1 tablet by mouth daily.    Marland Kitchen OVER THE COUNTER MEDICATION Take 1 tablet by mouth daily. Vitamin d 200 unit    . pseudoephedrine (SUDAFED) 30 MG tablet Take 30 mg by mouth every 4 (four) hours as needed for congestion.     . traMADol (ULTRAM) 50 MG tablet Take 2 tablets (100 mg total) by mouth every 6 (six) hours as needed. 20 tablet 0   No current facility-administered medications on file prior to visit.     No Known Allergies  Family History  Problem Relation Age of Onset  . Hypertension Mother   . Cancer Mother        ovarian; uterine  . Ovarian cancer Mother   . Thyroid disease Mother   . Hypothyroidism Mother   . Cancer Sister        skin  . Cancer Sister        lung  . Pneumonia Father   . Heart attack Maternal Grandmother   . Diabetes Neg Hx     BP 124/84   Pulse 80   Wt 204 lb (  92.5 kg)   SpO2 98%   BMI 32.93 kg/m     Review of Systems denies blurry vision, headache, chest pain, sob, n/v, urinary frequency, excessive diaphoresis, depression, cold intolerance, and rhinorrhea.  She has gained weight.  She has intermitt leg cramps and easy bruising.     Objective:   Physical Exam VS: see vs page GEN: no distress HEAD: head: no deformity eyes: no periorbital swelling, no proptosis external nose and ears are normal mouth: no lesion seen NECK: supple, thyroid is not enlarged CHEST WALL: no deformity LUNGS: clear to auscultation CV: reg rate and rhythm, no murmur ABD: abdomen is soft, nontender.  no hepatosplenomegaly.  not distended.  no hernia MUSCULOSKELETAL: muscle bulk and strength are grossly normal.  no obvious joint swelling.  gait is normal and steady EXTEMITIES: no deformity.  no ulcer on the feet.  feet are of normal color and temp.  no edema PULSES: dorsalis pedis intact bilat.  no carotid bruit NEURO:  cn  2-12 grossly intact.   readily moves all 4's.  sensation is intact to touch on the feet SKIN:  Normal texture and temperature.  No rash or suspicious lesion is visible.   NODES:  None palpable at the neck PSYCH: alert, well-oriented.  Does not appear anxious nor depressed.   A1c=10.0%  I have reviewed outside records, and summarized: Pt was noted to have elevated a1c, and referred here.  Insulin was resumed in early 2018, due to severely elevated a1c.  Risks of hyperglycemia were reviewed with patient.       Assessment & Plan:  Insulin-requiring type 2 DM: severe exacerbation. we will need to take this complex situation in stages  Patient Instructions  good diet and exercise significantly improve the control of your diabetes.  please let me know if you wish to be referred to a dietician.  high blood sugar is very risky to your health.  you should see an eye doctor and dentist every year.  It is very important to get all recommended vaccinations.  Controlling your blood pressure and cholesterol drastically reduces the damage diabetes does to your body.  Those who smoke should quit.  Please discuss these with your doctor.  check your blood sugar twice a day.  vary the time of day when you check, between before the 3 meals, and at bedtime.  also check if you have symptoms of your blood sugar being too high or too low.  please keep a record of the readings and bring it to your next appointment here (or you can bring the meter itself).  You can write it on any piece of paper.  please call us sooner if your blood sugar goes below 70, or if you have a lot of readings over 200.  I have sent prescriptions to your pharmacy: to change victoza to trulicity, and to increase levemir. Please continue the same glucovance for now.   Please come back for a follow-up appointment in 3 months.  Please call or message [place] next week, to tell us how the blood sugar is doing.

## 2017-08-29 NOTE — Patient Instructions (Addendum)
good diet and exercise significantly improve the control of your diabetes.  please let me know if you wish to be referred to a dietician.  high blood sugar is very risky to your health.  you should see an eye doctor and dentist every year.  It is very important to get all recommended vaccinations.  Controlling your blood pressure and cholesterol drastically reduces the damage diabetes does to your body.  Those who smoke should quit.  Please discuss these with your doctor.  check your blood sugar twice a day.  vary the time of day when you check, between before the 3 meals, and at bedtime.  also check if you have symptoms of your blood sugar being too high or too low.  please keep a record of the readings and bring it to your next appointment here (or you can bring the meter itself).  You can write it on any piece of paper.  please call us sooner if your blood sugar goes below 70, or if you have a lot of readings over 200.  I have sent prescriptions to your pharmacy: to change victoza to trulicity, and to increase levemir. Please continue the same glucovance for now.   Please come back for a follow-up appointment in 3 months.  Please call or message [place] next week, to tell us how the blood sugar is doing.

## 2017-09-09 ENCOUNTER — Other Ambulatory Visit (HOSPITAL_COMMUNITY): Payer: Self-pay | Admitting: Surgery

## 2017-09-09 DIAGNOSIS — Z9884 Bariatric surgery status: Secondary | ICD-10-CM

## 2017-10-03 ENCOUNTER — Ambulatory Visit (HOSPITAL_COMMUNITY)
Admission: RE | Admit: 2017-10-03 | Discharge: 2017-10-03 | Disposition: A | Discharge status: Home / Self Care | Payer: BC Managed Care – PPO | Source: Ambulatory Visit | Attending: Surgery | Admitting: Surgery

## 2017-10-03 DIAGNOSIS — Z9884 Bariatric surgery status: Secondary | ICD-10-CM | POA: Diagnosis not present

## 2017-10-04 ENCOUNTER — Encounter: Payer: Self-pay | Admitting: Endocrinology

## 2017-10-04 LAB — HM DIABETES EYE EXAM

## 2017-11-01 ENCOUNTER — Encounter: Payer: Self-pay | Admitting: Endocrinology

## 2017-11-03 ENCOUNTER — Other Ambulatory Visit: Payer: Self-pay | Admitting: Obstetrics and Gynecology

## 2017-11-03 DIAGNOSIS — Z1231 Encounter for screening mammogram for malignant neoplasm of breast: Secondary | ICD-10-CM

## 2017-12-02 ENCOUNTER — Ambulatory Visit (INDEPENDENT_AMBULATORY_CARE_PROVIDER_SITE_OTHER): Payer: BC Managed Care – PPO | Admitting: Endocrinology

## 2017-12-02 ENCOUNTER — Encounter: Payer: Self-pay | Admitting: Endocrinology

## 2017-12-02 VITALS — BP 160/90 | HR 64 | Wt 214.4 lb

## 2017-12-02 DIAGNOSIS — Z794 Long term (current) use of insulin: Secondary | ICD-10-CM | POA: Diagnosis not present

## 2017-12-02 DIAGNOSIS — E119 Type 2 diabetes mellitus without complications: Secondary | ICD-10-CM | POA: Diagnosis not present

## 2017-12-02 LAB — POCT GLYCOSYLATED HEMOGLOBIN (HGB A1C): HEMOGLOBIN A1C: 9.3

## 2017-12-02 MED ORDER — INSULIN ASPART 100 UNIT/ML FLEXPEN: 25.0000 [IU] | PEN_INJECTOR | Freq: Three times a day (TID) | SUBCUTANEOUS | 3 refills | Status: DC

## 2017-12-02 MED ORDER — METFORMIN HCL ER 500 MG PO TB24: 2000.0000 mg | ORAL_TABLET | Freq: Every day | ORAL | 3 refills | Status: DC

## 2017-12-02 MED ORDER — INSULIN ASPART 100 UNIT/ML FLEXPEN: 25.0000 [IU] | PEN_INJECTOR | Freq: Three times a day (TID) | SUBCUTANEOUS | 3 refills | Status: AC

## 2017-12-02 NOTE — Patient Instructions (Addendum)
check your blood sugar twice a day.  vary the time of day when you check, between before the 3 meals, and at bedtime.  also check if you have symptoms of your blood sugar being too high or too low.  please keep a record of the readings and bring it to your next appointment here (or you can bring the meter itself).  You can write it on any piece of paper.  please call us sooner if your blood sugar goes below 70, or if you have a lot of readings over 200.  I have sent prescriptions to your pharmacy: to change levemir to novolog, and to change metformin/glyburide to metformin only.   Please come back for a follow-up appointment in 3 months.  Please call or message [place] next week, to tell us how the blood sugar is doing.

## 2017-12-02 NOTE — Progress Notes (Signed)
9

## 2017-12-02 NOTE — Progress Notes (Signed)
Subjective:    Patient ID: Felicia Hayden, female    DOB: Jan 03, 1961, 57 y.o.   MRN: [id number]  HPI  Pt returns for f/u of diabetes mellitus: DM type: Insulin-requiring type 2 Dx'ed: 5329 Complications: none Therapy: insulin since soon after dx, trulicity, and 2 oral meds GDM: never DKA: never Severe hypoglycemia: never Pancreatitis: never Pancreatic imaging: never Other: she takes qd insulin, at least for now Interval history: no cbg record, but states cbg's vary from 184-235 fasting, which is when she checks.  She takes levemir qam.  Past Medical History:  Diagnosis Date  . Abnormal Pap smear   . Diabetes mellitus without complication ([place])   . History of abnormal Pap smear 1978   S/P cryotherapy  . Hyperlipidemia     Past Surgical History:  Procedure Laterality Date  . DILATION AND CURETTAGE OF UTERUS  03/1999  . LAPAROSCOPIC GASTRIC BANDING    . TONSILLECTOMY  1983    Social History   Socioeconomic History  . Marital status: Married    Spouse name: Not on file  . Number of children: Not on file  . Years of education: Not on file  . Highest education level: Not on file  Social Needs  . Financial resource strain: Not on file  . Food insecurity - worry: Not on file  . Food insecurity - inability: Not on file  . Transportation needs - medical: Not on file  . Transportation needs - non-medical: Not on file  Occupational History  . Not on file  Tobacco Use  . Smoking status: Never Smoker  . Smokeless tobacco: Never Used  Substance and Sexual Activity  . Alcohol use: No  . Drug use: No  . Sexual activity: Yes    Birth control/protection: Pill    Comment: zarah  Other Topics Concern  . Not on file  Social History Narrative  . Not on file    Current Outpatient Medications on File Prior to Visit  Medication Sig Dispense Refill  . acetaminophen (TYLENOL) 325 MG tablet Take 325 mg by mouth every 6 (six) hours as needed.    . calcium-vitamin D (OSCAL  WITH D) 500-200 MG-UNIT per tablet Take 1 tablet by mouth.    . drospirenone-ethinyl estradiol (ZARAH) 3-0.03 MG tablet Take 1 tablet by mouth daily. 1 Package 11  . Dulaglutide (TRULICITY) 1.5 JM/4.2AS SOPN Inject 1.5 mg into the skin once a week. 12 pen 3  . fluticasone (FLONASE) 50 MCG/ACT nasal spray Place 2 sprays into the nose daily.    Marland Kitchen levocetirizine (XYZAL) 5 MG tablet Take 5 mg by mouth every evening.    . lidocaine (LIDODERM) 5 % Place 1 patch onto the skin every 12 (twelve) hours. Remove & Discard patch within 12 hours or as directed by MD    . loratadine (CLARITIN) 10 MG tablet Take 10 mg by mouth daily.    . Meloxicam 7.5 MG/5ML SUSP Take by mouth.    . Multiple Vitamin (MULTIVITAMIN WITH MINERALS) TABS tablet Take 1 tablet by mouth daily.    Marland Kitchen OVER THE COUNTER MEDICATION Take 1 tablet by mouth daily. Vitamin d 200 unit    . pantoprazole (PROTONIX) 40 MG tablet Take 40 mg by mouth daily.    . pseudoephedrine (SUDAFED) 30 MG tablet Take 30 mg by mouth every 4 (four) hours as needed for congestion.     . traMADol (ULTRAM) 50 MG tablet Take 2 tablets (100 mg total) by mouth every 6 (six) hours  as needed. 20 tablet 0   No current facility-administered medications on file prior to visit.     No Known Allergies  Family History  Problem Relation Age of Onset  . Hypertension Mother   . Cancer Mother        ovarian; uterine  . Ovarian cancer Mother   . Thyroid disease Mother   . Hypothyroidism Mother   . Cancer Sister        skin  . Cancer Sister        lung  . Pneumonia Father   . Heart attack Maternal Grandmother   . Diabetes Neg Hx     BP (!) 160/90 (BP Location: Left Arm, Patient Position: Sitting, Cuff Size: Normal)   Pulse 64   Wt 214 lb 6.4 oz (97.3 kg)   SpO2 97%   BMI 34.61 kg/m   Review of Systems She denies hypoglycemia    Objective:   Physical Exam VITAL SIGNS:  See vs page GENERAL: no distress Pulses: dorsalis pedis intact bilat.   MSK: no  deformity of the feet CV: no leg edema Skin:  no ulcer on the feet.  normal color and temp on the feet. Neuro: sensation is intact to touch on the feet   Lab Results  Component Value Date   HGBA1C 9.3 12/02/2017      Assessment & Plan:  Insulin-requiring type 2 DM: she needs increased rx.  We discussed.  She agrees to take multiple daily injections.   Patient Instructions  check your blood sugar twice a day.  vary the time of day when you check, between before the 3 meals, and at bedtime.  also check if you have symptoms of your blood sugar being too high or too low.  please keep a record of the readings and bring it to your next appointment here (or you can bring the meter itself).  You can write it on any piece of paper.  please call us sooner if your blood sugar goes below 70, or if you have a lot of readings over 200.  I have sent prescriptions to your pharmacy: to change levemir to novolog, and to change metformin/glyburide to metformin only.   Please come back for a follow-up appointment in 3 months.  Please call or message [place] next week, to tell us how the blood sugar is doing.

## 2017-12-23 ENCOUNTER — Ambulatory Visit
Admission: RE | Admit: 2017-12-23 | Discharge: 2017-12-23 | Disposition: A | Discharge status: Home / Self Care | Payer: BC Managed Care – PPO | Source: Ambulatory Visit | Attending: Obstetrics and Gynecology | Admitting: Obstetrics and Gynecology

## 2017-12-23 DIAGNOSIS — Z1231 Encounter for screening mammogram for malignant neoplasm of breast: Secondary | ICD-10-CM

## 2017-12-26 ENCOUNTER — Other Ambulatory Visit: Payer: Self-pay | Admitting: Obstetrics and Gynecology

## 2017-12-26 DIAGNOSIS — R928 Other abnormal and inconclusive findings on diagnostic imaging of breast: Secondary | ICD-10-CM

## 2018-01-02 ENCOUNTER — Ambulatory Visit
Admission: RE | Admit: 2018-01-02 | Discharge: 2018-01-02 | Disposition: A | Discharge status: Home / Self Care | Payer: BC Managed Care – PPO | Source: Ambulatory Visit | Attending: Obstetrics and Gynecology | Admitting: Obstetrics and Gynecology

## 2018-01-02 DIAGNOSIS — R928 Other abnormal and inconclusive findings on diagnostic imaging of breast: Secondary | ICD-10-CM

## 2018-01-09 ENCOUNTER — Encounter: Payer: Self-pay | Admitting: Endocrinology

## 2018-01-30 ENCOUNTER — Encounter: Payer: Self-pay | Admitting: Endocrinology

## 2018-02-15 ENCOUNTER — Encounter: Payer: Self-pay | Admitting: Endocrinology

## 2018-02-15 ENCOUNTER — Other Ambulatory Visit: Payer: Self-pay | Admitting: Endocrinology

## 2018-02-15 MED ORDER — LIRAGLUTIDE 18 MG/3ML ~~LOC~~ SOPN: 1.8000 mg | PEN_INJECTOR | Freq: Every day | SUBCUTANEOUS | 11 refills | Status: DC

## 2018-03-03 ENCOUNTER — Ambulatory Visit: Payer: BC Managed Care – PPO | Admitting: Endocrinology

## 2018-03-07 DIAGNOSIS — M5416 Radiculopathy, lumbar region: Secondary | ICD-10-CM | POA: Insufficient documentation

## 2018-03-07 DIAGNOSIS — E559 Vitamin D deficiency, unspecified: Secondary | ICD-10-CM | POA: Insufficient documentation

## 2018-03-07 DIAGNOSIS — M50321 Other cervical disc degeneration at C4-C5 level: Secondary | ICD-10-CM | POA: Insufficient documentation

## 2018-03-13 ENCOUNTER — Ambulatory Visit: Payer: BC Managed Care – PPO | Admitting: Endocrinology

## 2018-10-06 DIAGNOSIS — M1811 Unilateral primary osteoarthritis of first carpometacarpal joint, right hand: Secondary | ICD-10-CM | POA: Insufficient documentation

## 2018-11-10 ENCOUNTER — Other Ambulatory Visit: Payer: Self-pay | Admitting: Obstetrics and Gynecology

## 2018-11-10 DIAGNOSIS — Z1231 Encounter for screening mammogram for malignant neoplasm of breast: Secondary | ICD-10-CM

## 2018-11-28 DIAGNOSIS — M419 Scoliosis, unspecified: Secondary | ICD-10-CM | POA: Insufficient documentation

## 2018-12-25 ENCOUNTER — Ambulatory Visit
Admission: RE | Admit: 2018-12-25 | Discharge: 2018-12-25 | Disposition: A | Discharge status: Home / Self Care | Payer: BC Managed Care – PPO | Source: Ambulatory Visit | Attending: Obstetrics and Gynecology | Admitting: Obstetrics and Gynecology

## 2018-12-25 DIAGNOSIS — Z1231 Encounter for screening mammogram for malignant neoplasm of breast: Secondary | ICD-10-CM

## 2020-03-07 ENCOUNTER — Encounter: Payer: Self-pay | Admitting: Allergy

## 2020-03-07 ENCOUNTER — Ambulatory Visit: Payer: BC Managed Care – PPO | Admitting: Allergy

## 2020-03-07 ENCOUNTER — Other Ambulatory Visit: Payer: Self-pay

## 2020-03-07 VITALS — BP 126/84 | HR 79 | Temp 98.0°F | Resp 16 | Ht 66.0 in | Wt 210.8 lb

## 2020-03-07 DIAGNOSIS — L299 Pruritus, unspecified: Secondary | ICD-10-CM | POA: Diagnosis not present

## 2020-03-07 DIAGNOSIS — J3089 Other allergic rhinitis: Secondary | ICD-10-CM | POA: Diagnosis not present

## 2020-03-07 DIAGNOSIS — H1013 Acute atopic conjunctivitis, bilateral: Secondary | ICD-10-CM

## 2020-03-07 DIAGNOSIS — K9049 Malabsorption due to intolerance, not elsewhere classified: Secondary | ICD-10-CM

## 2020-03-07 NOTE — Progress Notes (Signed)
New Patient Note  RE: Felicia Hayden MRN: [id number] DOB: 12/25/1960 Date of Office Visit: 03/07/2020  Referring provider: No ref. provider found Primary care provider: Jonathon Jordan, MD  Chief Complaint: Itching and allergies  History of present illness: Felicia Hayden is a 59 y.o. female presenting today for evaluation of allergic reaction and allergic rhinitis.   She states she develops itching across her forehead, down her nose and will develop watery eyes.  This has been an issue over the past year at least. She denies any other itching elsewhere, no visible or palpable rash nor any respiratory, GI or CV related symptoms.  She feels certain foods seem to make this itchy and watery eyes develop including when she eats foods containing garlic, onions, nuts, fish, shellfish and cheese. She states she has been trying pinpoint triggers and also thinks that itchy and watery eyes could also be triggered potentially by detergents/soaps as well.    With Fish and shellfish she also notices that her throat will get scratchy feeling.   She takes claritin and flonase daily.  She does have azelastine nasal spray that she states she will use if having more nasal symptoms that the Flonase is not helping.  And she also has optivar eyedrops as needed.  The optivar does help the watery eyes.  She feels benadryl helps the most overall.  She states she has tried xyzal before and did not help symptoms.  She did have allergy skin testing about 25-30 years ago and does recall having positive results to trees, ragweed.   No history of asthma or eczema.  Review of systems: Review of Systems  Constitutional: Negative.   HENT:       See HPI  Eyes:       See HPI  Respiratory: Negative.   Cardiovascular: Negative.   Gastrointestinal: Negative.   Musculoskeletal: Negative.   Skin:       See HPI  Neurological: Negative.     All other systems negative unless noted above in HPI   Past medical history: Past Medical History:  Diagnosis Date  . Abnormal Pap smear   . Diabetes mellitus without complication ([place])   . History of abnormal Pap smear 1978   S/P cryotherapy  . Hyperlipidemia     Past surgical history: Past Surgical History:  Procedure Laterality Date  . DILATION AND CURETTAGE OF UTERUS  03/1999  . LAPAROSCOPIC GASTRIC BANDING    . TONSILLECTOMY  1983    Family history:  Family History  Problem Relation Age of Onset  . Hypertension Mother   . Cancer Mother        ovarian; uterine  . Ovarian cancer Mother   . Thyroid disease Mother   . Hypothyroidism Mother   . Cancer Sister        skin  . Cancer Sister        lung  . Pneumonia Father   . Heart attack Maternal Grandmother   . Diabetes Neg Hx   . Allergic rhinitis Neg Hx   . Angioedema Neg Hx   . Asthma Neg Hx   . Atopy Neg Hx   . Eczema Neg Hx   . Immunodeficiency Neg Hx   . Urticaria Neg Hx     Social history: She lives in a home with carpeting in the bedroom with gas heating and central cooling.  No pets in the home.  No concern for water damage, mildew or roaches in the home.  She  is an extension specialist.  Denies a smoking history.  Medication List: Current Outpatient Medications  Medication Sig Dispense Refill  . acetaminophen (TYLENOL) 325 MG tablet Take 325 mg by mouth every 6 (six) hours as needed.    Marland Kitchen azelastine (OPTIVAR) 0.05 % ophthalmic solution     . Azelastine HCl 0.15 % SOLN Place 1 spray into both nostrils 2 (two) times daily.    . calcium-vitamin D (OSCAL WITH D) 500-200 MG-UNIT per tablet Take 1 tablet by mouth.    . drospirenone-ethinyl estradiol (ZARAH) 3-0.03 MG tablet Take 1 tablet by mouth daily. 1 Package 11  . fluticasone (FLONASE) 50 MCG/ACT nasal spray Place 2 sprays into the nose daily.    . insulin aspart (NOVOLOG FLEXPEN) 100 UNIT/ML FlexPen Inject 25 Units into the skin 3 (three) times daily with meals. And nano pen needles 3/day 25 pen 3  .  JARDIANCE 25 MG TABS tablet Take 25 mg by mouth daily.    Marland Kitchen lidocaine (LIDODERM) 5 % Place 1 patch onto the skin every 12 (twelve) hours. Remove & Discard patch within 12 hours or as directed by MD    . loratadine (CLARITIN) 10 MG tablet Take 10 mg by mouth daily.    . Norethindrone-Ethinyl Estradiol-Fe Biphas (LO LOESTRIN FE) 1 MG-10 MCG / 10 MCG tablet Take by mouth.    Marland Kitchen OZEMPIC, 1 MG/DOSE, 2 MG/1.5ML SOPN Inject 1 mg into the skin once a week.    . pseudoephedrine (SUDAFED) 30 MG tablet Take 30 mg by mouth every 4 (four) hours as needed for congestion.     Tyler Aas FLEXTOUCH 100 UNIT/ML FlexTouch Pen      No current facility-administered medications for this visit.    Known medication allergies: No Known Allergies   Physical examination: Blood pressure 126/84, pulse 79, temperature 98 F (36.7 C), temperature source Temporal, resp. rate 16, height 5\' 6"  (1.676 m), weight 210 lb 12.8 oz (95.6 kg), SpO2 98 %.  General: Alert, interactive, in no acute distress. HEENT: PERRLA, TMs pearly gray, turbinates mildly edematous without discharge, post-pharynx non erythematous. Neck: Supple without lymphadenopathy. Lungs: Clear to auscultation without wheezing, rhonchi or rales. {no increased work of breathing. CV: Normal S1, S2 without murmurs. Abdomen: Nondistended, nontender. Skin: Warm and dry, without lesions or rashes. Extremities:  No clubbing, cyanosis or edema. Neuro:   Grossly intact.  Diagnositics/Labs:  Allergy testing: Environmental allergy skin prick testing is positive to [place], pecan and cockroach. Intradermal testing is positive to [employer] and cat hair. Select food allergy skin prick testing is negative Allergy testing results were read and interpreted by provider, documented by clinical staff.   Assessment and plan:   Allergic rhinitis with conjunctivitis - environmental allergy testing today is positive to grass pollen, tree pollen, cat and cockroach - allergen  avoidance measures discussed/handouts provided - recommend changing Claritin to another more effective long-acting antihistamine like Zyrtec 10mg  or Allegra 180mg  - use Flonase 2 sprays each nostril daily for 1-2 weeks at a time if having nasal congestion - use nasal Azelastine 2 sprays each nostril twice a day if you are having nasal drainage - for itchy/watery eyes use Optivar 1 drop each eye twice a day as needed - reserve benadryl for breakthrough symptoms - avoid strong odors/perfumes as best as possible to decrease allergy symptoms and headache  Pruritus/food intolerance - select food allergy testing is negative.  Will obtain IgE levels to ensure you do not have food allergy and need to strictly avoid  any foods.  If any labs return positive then would recommend you have access to an epinephrine device.  Avoid these foods in diet until labs return.    Follow-up 4-6 months or sooner if needed  I appreciate the opportunity to take part in [place] care. Please do not hesitate to contact me with questions.  Sincerely,   Prudy Feeler, MD Allergy/Immunology [facility]

## 2020-03-07 NOTE — Patient Instructions (Addendum)
-   environmental allergy testing today is positive to grass pollen, tree pollen, cat and cockroach - allergen avoidance measures discussed/handouts provided - recommend changing Claritin to another more effective long-acting antihistamine like Zyrtec 10mg  or Allegra 180mg  - use Flonase 2 sprays each nostril daily for 1-2 weeks at a time if having nasal congestion - use nasal Azelastine 2 sprays each nostril twice a day if you are having nasal drainage - for itchy/watery eyes use Optivar 1 drop each eye twice a day as needed - reserve benadryl for breakthrough symptoms  - select food allergy testing is negative.  Will obtain IgE levels to ensure you do not have food allergy and need to strictly avoid any foods.  If any labs return positive then would recommend you have access to an epinephrine device.  Avoid these foods in diet until labs return.    - avoid strong odors/perfumes as best as possible to decrease allergy symptoms and headache    Follow-up 4-6 months or sooner if needed

## 2020-03-12 LAB — ALLERGY PANEL 18, NUT MIX GROUP
Allergen Coconut IgE: <0.1 kU/L
F020-IgE Almond: <0.1 kU/L
F202-IgE Cashew Nut: <0.1 kU/L
Hazelnut (Filbert) IgE: <0.1 kU/L
Peanut IgE: <0.1 kU/L
Pecan Nut IgE: <0.1 kU/L
Sesame Seed IgE: <0.1 kU/L

## 2020-03-12 LAB — ALLERGEN PROFILE, SHELLFISH
Clam IgE: <0.1 kU/L
F023-IgE Crab: <0.1 kU/L
F080-IgE Lobster: <0.1 kU/L
F290-IgE Oyster: <0.1 kU/L
Scallop IgE: <0.1 kU/L
Shrimp IgE: <0.1 kU/L

## 2020-03-12 LAB — ALLERGEN PROFILE, FOOD-FISH
Allergen Mackerel IgE: <0.1 kU/L
Allergen Salmon IgE: <0.1 kU/L
Allergen Trout IgE: <0.1 kU/L
Allergen Walley Pike IgE: <0.1 kU/L
Codfish IgE: <0.1 kU/L
Halibut IgE: <0.1 kU/L
Tuna: <0.1 kU/L

## 2020-03-12 LAB — ALLERGEN, ONION, F48: Allergen Onion IgE: <0.1 kU/L

## 2020-03-12 LAB — ALLERGEN MILK: Milk IgE: 0.11 kU/L — AB

## 2020-03-12 LAB — ALLERGEN, GARLIC, F47: Allergen Garlic IgE: <0.1 kU/L

## 2020-09-11 ENCOUNTER — Ambulatory Visit: Payer: BC Managed Care – PPO | Admitting: Allergy

## 2020-09-26 DIAGNOSIS — M25511 Pain in right shoulder: Secondary | ICD-10-CM | POA: Insufficient documentation

## 2020-10-15 DIAGNOSIS — M898X1 Other specified disorders of bone, shoulder: Secondary | ICD-10-CM | POA: Insufficient documentation

## 2021-01-27 ENCOUNTER — Other Ambulatory Visit: Payer: Self-pay | Admitting: Surgery

## 2021-01-27 ENCOUNTER — Other Ambulatory Visit (HOSPITAL_COMMUNITY): Payer: Self-pay | Admitting: Surgery

## 2021-02-05 ENCOUNTER — Ambulatory Visit (HOSPITAL_COMMUNITY)
Admission: RE | Admit: 2021-02-05 | Discharge: 2021-02-05 | Disposition: A | Discharge status: Home / Self Care | Payer: BC Managed Care – PPO | Source: Ambulatory Visit | Attending: Surgery | Admitting: Surgery

## 2021-02-05 ENCOUNTER — Other Ambulatory Visit: Payer: Self-pay

## 2021-03-11 ENCOUNTER — Encounter: Payer: BC Managed Care – PPO | Attending: Surgery | Admitting: Skilled Nursing Facility1

## 2021-03-11 ENCOUNTER — Other Ambulatory Visit: Payer: Self-pay

## 2021-03-11 DIAGNOSIS — E669 Obesity, unspecified: Secondary | ICD-10-CM | POA: Insufficient documentation

## 2021-03-11 DIAGNOSIS — E119 Type 2 diabetes mellitus without complications: Secondary | ICD-10-CM | POA: Diagnosis present

## 2021-03-11 DIAGNOSIS — Z794 Long term (current) use of insulin: Secondary | ICD-10-CM | POA: Diagnosis present

## 2021-03-11 NOTE — Progress Notes (Signed)
Nutrition Assessment for Bariatric Surgery Medical Nutrition Therapy Appt Start Time: 10:05am    End Time: 11:20am  Patient was seen on 03/11/21 for Pre-Operative Nutrition Assessment. Letter of approval faxed to [facility] bariatric surgery program coordinator on 03/11/21.   Referral stated Supervised Weight Loss (SWL) visits needed: 0  Planned surgery: sleeve (post lap band in 2005) Pt expectation of surgery: wants to get off medications, reduce arthritic pain Pt expectation of dietitian: none identified  Not cleared at this time:  Pt to follow up for minimum of one more visit to assist pt with progressing through stages of change/further nutrition education. RD advised pt that this follow up visit is not mandated through insurance. Pt verbalized agreement.   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 No  Micronutrient Nutrition Focused Physical Exam: Hair: No issues observed Eyes: No issues observed Mouth: No issues observed Neck: No issues observed Nails: No issues observed Skin: No issues observed  Lifestyle & Dietary Hx Pt reports she had gastric band surgery in 2005. She had the fluid removed three years ago due to problems with reflux.  Pt reports lowest weight after band was 174 lbs.  She was stable at this for about a year and half.   Pt reports she has a personal goal of an HgA1c <7 but has taken predisone and other steroids that have affected her blood sugars.  Pt reports she checks her blood sugar with her Libre CGM.  Pt reports eating until uncomfortably full sometimes.  Pt reports she would rather snack  than have sit down meals.  Pt reports she likes seasonal fruit including cherries & honeydew,and is willing to snack on carrots, celery, cucumbers for evening snack or at lunch.  Pt reports cutting out diet mt dew will be her biggest challenge but she is willing to drink more water and is motivated to protect her kidneys.  24-Hr Dietary Recall First Meal: hot tea w splenda and sugar free creamer, slim fast high protein shake Snack: none Second Meal: wend[employer] burger fries nuggets soda "biggy bag" Snack: none Third Meal: usually skips Snack: chips, chocolate, slim fast shake Beverages: diet caffeine free mountain dew, hot tea   Estimated Energy Needs Calories: 1800  NUTRITION DIAGNOSIS  Overweight/obesity ([place]-3.3) related to past poor dietary habits and physical inactivity as evidenced by patient w/ planned sleeve surgery following dietary guidelines for continued weight loss.    NUTRITION INTERVENTION  Nutrition counseling (C-1) and education (E-2) to facilitate bariatric surgery goals.   Pre-Op Goals Reviewed with the Patient . Track food and beverage intake (pen and paper, MyFitness Pal, Baritastic app, etc.) . Make healthy food choices while monitoring portion sizes (add fruit, reduce chips and chocolate which may lead to her eating dinner) . Consume 3 meals per day or try to eat every 3-5 hours (salads for dinner, tomatoes w/s&p) . Avoid concentrated sugars and fried foods . Keep sugar & fat in the single digits per serving on food labels . Practice CHEWING your food (aim for applesauce consistency) . Practice not drinking 15 minutes before, during, and 30 minutes after each meal and snack . Avoid all carbonated beverages (  ex: soda, sparkling beverages, cut back on diet mt dew)  . Limit caffeinated beverages (ex: coffee, tea, energy drinks) . Avoid all sugar-sweetened beverages (ex: regular soda, sports drinks)  . Avoid alcohol  . Aim for 64-100 ounces of FLUID daily (with at  least half of fluid intake being plain water)  . Aim for at least 60-80 grams of PROTEIN daily . Look for a liquid protein source that contains ?15 g protein and ?5 g carbohydrate (ex: shakes, drinks, shots) . Make a list of non-food related activities . Physical activity is an important part of a healthy lifestyle so keep it moving! The goal is to reach 150 minutes of exercise per week, including cardiovascular and weight baring activity.  (walk, chair exercise, aqua exercise)  *Goals that are bolded indicate the pt would like to start working towards these  Handouts Provided Include  . Bariatric Surgery handouts (Nutrition Visits, Pre-Op Goals, Protein Shakes, Vitamins & Minerals)  Learning Style & Readiness for Change Teaching method utilized: Visual & Auditory  Demonstrated degree of understanding via: Teach Back  Readiness Level: contemplation Barriers to learning/adherence to lifestyle change: none identified at this time  RD's Notes for Next Visit . Assess pt's progress on practicing selected behavioral goals  MONITORING & EVALUATION Dietary intake, weekly physical activity, body weight, and pre-op goals reached at next nutrition visit.    Next Steps  Pt to follow up on May 17th at 2:30pm for continued behavior change counseling. Patient is to follow up at [facility] for Pre-Op Class >2 weeks before surgery for further nutrition education.

## 2021-03-16 ENCOUNTER — Other Ambulatory Visit: Payer: Self-pay | Admitting: Orthopedic Surgery

## 2021-03-16 DIAGNOSIS — M25551 Pain in right hip: Secondary | ICD-10-CM

## 2021-03-25 ENCOUNTER — Ambulatory Visit
Admission: RE | Admit: 2021-03-25 | Discharge: 2021-03-25 | Disposition: A | Discharge status: Home / Self Care | Payer: BC Managed Care – PPO | Source: Ambulatory Visit | Attending: Orthopedic Surgery | Admitting: Orthopedic Surgery

## 2021-03-25 ENCOUNTER — Other Ambulatory Visit: Payer: Self-pay

## 2021-03-25 DIAGNOSIS — M25551 Pain in right hip: Secondary | ICD-10-CM

## 2021-03-26 ENCOUNTER — Encounter: Payer: Self-pay | Admitting: Neurology

## 2021-03-26 ENCOUNTER — Ambulatory Visit: Payer: BC Managed Care – PPO | Admitting: Neurology

## 2021-03-26 VITALS — BP 159/82 | HR 70 | Ht 66.0 in | Wt 236.0 lb

## 2021-03-26 DIAGNOSIS — Z9189 Other specified personal risk factors, not elsewhere classified: Secondary | ICD-10-CM

## 2021-03-26 DIAGNOSIS — E669 Obesity, unspecified: Secondary | ICD-10-CM

## 2021-03-26 DIAGNOSIS — R011 Cardiac murmur, unspecified: Secondary | ICD-10-CM

## 2021-03-26 DIAGNOSIS — R351 Nocturia: Secondary | ICD-10-CM | POA: Diagnosis not present

## 2021-03-26 DIAGNOSIS — R0683 Snoring: Secondary | ICD-10-CM

## 2021-03-26 NOTE — Patient Instructions (Signed)

## 2021-03-26 NOTE — Progress Notes (Addendum)
Subjective:    Patient ID: Felicia Hayden is a 60 y.o. female.  HPI     Star Age, MD, PhD [facility] [address] [place], [place] [address]  Dear Dr. Thermon Leyland,   I saw your patient, Felicia Hayden, on your kind referral from the sleep clinic today for initial consultation of her sleep disorder, in particular, concern for underlying obstructive sleep apnea.  Patient has appointment today.  As you know, Felicia Hayden is a 60 year old right-handed woman with an underlying medical history of diabetes, hyperlipidemia, allergies, reflux disease, and obesity, who reports no snoring and does not report (corrections made on 05/12/2021) excessive daytime somnolence.  She has a history of laparoscopic adjustable gastric band placement.  Her band had to deflated due to dysphagia and severe reflux.  I reviewed your office note from 01/22/2021.  She is being considered for sleeve gastrectomy.  Her Epworth sleepiness score is 1/24, fatigue severity score is 13 out of 63.  She reports having had a sleep study some 10 years ago which was negative for sleep apnea at the time per her report.  She denies recurrent morning headaches.  She has nocturia about once per average night, bedtime is generally around 11 and rise time around 8.  She works at [employer].  She is a non-smoker and does not use alcohol, does not drink caffeine on a daily basis.  She has right hip pain, recent MRI of the right hip showed advanced arthritis.  She takes tramadol and Robaxin for pain.  She is followed by [facility].  She lives with her husband.  She has a TV in her bedroom and turns it off before falling asleep.  She has no family history of sleep apnea.  She had a tonsillectomy when she was in college.  Her Past Medical History Is Significant For: Past Medical History:  Diagnosis Date   Abnormal Pap smear    Diabetes mellitus without complication (HCC)    History of  abnormal Pap smear 1978   S/P cryotherapy   Hyperlipidemia     Her Past Surgical History Is Significant For: Past Surgical History:  Procedure Laterality Date   DILATION AND CURETTAGE OF UTERUS  03/1999   LAPAROSCOPIC GASTRIC BANDING     TONSILLECTOMY  1983    Her Family History Is Significant For: Family History  Problem Relation Age of Onset   Hypertension Mother    Cancer Mother        ovarian; uterine   Ovarian cancer Mother    Thyroid disease Mother    Hypothyroidism Mother    Cancer Sister        skin   Cancer Sister        lung   Pneumonia Father    Heart attack Maternal Grandmother    Diabetes Neg Hx    Allergic rhinitis Neg Hx    Angioedema Neg Hx    Asthma Neg Hx    Atopy Neg Hx    Eczema Neg Hx    Immunodeficiency Neg Hx    Urticaria Neg Hx     Her Social History Is Significant For: Social History   Socioeconomic History   Marital status: Married    Spouse name: Not on file   Number of children: Not on file   Years of education: Not on file   Highest education level: Not on file  Occupational History   Not on file  Tobacco Use   Smoking  status: Former Smoker   Smokeless tobacco: Never Used  Substance and Sexual Activity   Alcohol use: No   Drug use: No   Sexual activity: Yes    Birth control/protection: Pill    Comment: zarah  Other Topics Concern   Not on file  Social History Narrative   Not on file   Social Determinants of Health   Financial Resource Strain: Not on file  Food Insecurity: Not on file  Transportation Needs: Not on file  Physical Activity: Not on file  Stress: Not on file  Social Connections: Not on file    Her Allergies Are:  No Known Allergies:   Her Current Medications Are:  Outpatient Encounter Medications as of 03/26/2021  Medication Sig   acetaminophen (TYLENOL) 325 MG tablet Take 325 mg by mouth every 6 (six) hours as needed.   azelastine (OPTIVAR) 0.05 % ophthalmic solution    Azelastine HCl 0.15 % SOLN  Place 1 spray into both nostrils 2 (two) times daily.   calcium-vitamin D (OSCAL WITH D) 500-200 MG-UNIT per tablet Take 1 tablet by mouth.   fluticasone (FLONASE) 50 MCG/ACT nasal spray Place 2 sprays into the nose daily.   Ibuprofen (MOTRIN IB PO) Motrin   insulin aspart (NOVOLOG FLEXPEN) 100 UNIT/ML FlexPen Inject 25 Units into the skin 3 (three) times daily with meals. And nano pen needles 3/day   JARDIANCE 25 MG TABS tablet Take 25 mg by mouth daily.   lidocaine (LIDODERM) 5 % Place 1 patch onto the skin every 12 (twelve) hours. Remove & Discard patch within 12 hours or as directed by MD   methocarbamol (ROBAXIN) 500 MG tablet Take 1 tablet by mouth every 6 (six) hours as needed.   OZEMPIC, 1 MG/DOSE, 2 MG/1.5ML SOPN Inject 1 mg into the skin once a week.   pseudoephedrine (SUDAFED) 30 MG tablet Take 30 mg by mouth every 4 (four) hours as needed for congestion.    traMADol (ULTRAM) 50 MG tablet Take 50 mg by mouth every 6 (six) hours as needed.   TRESIBA FLEXTOUCH 100 UNIT/ML FlexTouch Pen    [DISCONTINUED] drospirenone-ethinyl estradiol (ZARAH) 3-0.03 MG tablet Take 1 tablet by mouth daily.   [DISCONTINUED] loratadine (CLARITIN) 10 MG tablet Take 10 mg by mouth daily.   [DISCONTINUED] Norethindrone-Ethinyl Estradiol-Fe Biphas (LO LOESTRIN FE) 1 MG-10 MCG / 10 MCG tablet Take by mouth.   No facility-administered encounter medications on file as of 03/26/2021.  :   Review of Systems:  Out of a complete 14 point review of systems, all are reviewed and negative with the exception of these symptoms as listed below:  Review of Systems  Neurological:       Here for sleep consult. Pt reports sleep study 10 + years ago. At the time study was normal. Reports she is here for reveal for bariatric surgery consideration.   Epworth Sleepiness Scale 0= would never doze 1= slight chance of dozing 2= moderate chance of dozing 3= high chance of dozing  Sitting and reading:0 Watching TV:0 Sitting  inactive in a public place (ex. Theater or meeting):0 As a passenger in a car for an hour without a break:1 Lying down to rest in the afternoon:0 Sitting and talking to someone:0 Sitting quietly after lunch (no alcohol):0 In a car, while stopped in traffic:0 Total:1     Objective:  Neurological Exam  Physical Exam Physical Examination:   Vitals:   03/26/21 1058  BP: (!) 159/82  Pulse: 70    General  Examination: The patient is a very pleasant 60 y.o. female in no acute distress. She appears well-developed and well-nourished and well groomed.   HEENT: Normocephalic, atraumatic, pupils are equal, round and reactive to light, extraocular tracking is good without limitation to gaze excursion or nystagmus noted. Hearing is grossly intact. Face is symmetric with normal facial animation. Speech is clear with no dysarthria noted. There is no hypophonia. There is no lip, neck/head, jaw or voice tremor. Neck is supple with full range of passive and active motion. There are no carotid bruits on auscultation. Oropharynx exam reveals: mild mouth dryness, adequate dental hygiene.  Mallampati is class I.  Tonsils absent.  Neck circumference of 14 three-quarter inches.  She has a moderate overbite.  Tongue protrudes centrally and palate elevates symmetrically.   Chest: Clear to auscultation without wheezing, rhonchi or crackles noted.  Heart: S1+S2+0, regular and normal with a mild, 1/6 systolic murmur noted.     Abdomen: Soft, non-tender and non-distended with normal bowel sounds appreciated on auscultation.  Extremities: There is no pitting edema in the distal lower extremities bilaterally.   Skin: Warm and dry without trophic changes noted.   Musculoskeletal: exam reveals decreased range of motion right hip.  She reports hip pain.   Neurologically:  Mental status: The patient is awake, alert and oriented in all 4 spheres. Her immediate and remote memory, attention, language skills and fund  of knowledge are appropriate. There is no evidence of aphasia, agnosia, apraxia or anomia. Speech is clear with normal prosody and enunciation. Thought process is linear. Mood is normal and affect is normal.  Cranial nerves II - XII are as described above under HEENT exam.  Motor exam: Normal bulk, strength and tone is noted. There is no tremor. Fine motor skills and coordination: grossly intact.  Cerebellar testing: No dysmetria or intention tremor. There is no truncal or gait ataxia.  Sensory exam: intact to light touch in the upper and lower extremities.  Gait, station and balance: She stands with difficulty.  No walking aid.  She walks with a limp on the right.    Assessment and Plan:   In summary, Felicia Hayden is a very pleasant 60 y.o.-year old female with an underlying medical history of diabetes, hyperlipidemia, allergies, reflux disease, and obesity, whose history and physical exam are concerning for obstructive sleep apnea (OSA).  I noticed a mild systolic murmur.  She has no history of heart murmur.  She has no history of chest pain, palpitations or shortness of breath.  She does have a mildly elevated blood pressure reading today, could be a flow murmur but she is encouraged to talk to her primary care physician about this.  She has never seen cardiology.  I talked with the patient about my findings and the diagnosis of OSA, its prognosis and treatment options. I explained in particular the risks and ramifications of untreated moderate to severe OSA, especially with respect to developing cardiovascular disease down the Road, including congestive heart failure, difficult to treat hypertension, cardiac arrhythmias, or stroke. Even type 2 diabetes has, in part, been linked to untreated OSA. Symptoms of untreated OSA include daytime sleepiness, memory problems, mood irritability and mood disorder such as depression and anxiety, lack of energy, as well as recurrent headaches, especially  morning headaches. We talked about trying to maintain a healthy lifestyle in general, as well as the importance of weight control. We also talked about the importance of good sleep hygiene. I recommended the following at  this time: sleep study.   I explained the sleep test procedure to the patient and also outlined the difference between a laboratory attended sleep study versus a home sleep test.  I explained to her the CPAP or AutoPap treatment options and alternative treatment options such as surgical options and dental treatment option.  Given that she is embarking on weight loss surgery, realistically speaking, positive airway pressure treatment would be her most likely treatment option.  We will pick up our discussion after testing.  We will plan a follow-up accordingly.  I answered all her questions today and she was in agreement with the plan. Thank you very much for allowing me to participate in the care of this nice patient. If I can be of any further assistance to you please do not hesitate to call me at [phone].  Sincerely,   Star Age, MD, PhD

## 2021-03-29 ENCOUNTER — Other Ambulatory Visit: Payer: BC Managed Care – PPO

## 2021-03-31 ENCOUNTER — Ambulatory Visit: Payer: BC Managed Care – PPO | Admitting: Skilled Nursing Facility1

## 2021-04-01 ENCOUNTER — Telehealth: Payer: Self-pay

## 2021-04-01 NOTE — Telephone Encounter (Signed)
LVM for pt to call me back to schedule sleep study  

## 2021-04-06 ENCOUNTER — Telehealth: Payer: Self-pay

## 2021-04-06 NOTE — Telephone Encounter (Signed)
LVM for pt to call me back to schedule sleep study  

## 2021-04-14 ENCOUNTER — Telehealth: Payer: Self-pay

## 2021-04-14 NOTE — Telephone Encounter (Signed)
We have attempted to call the patient 2 times to schedule sleep study. Patient has been unavailable at the phone numbers we have on file and has not returned our calls. If patient calls back we will schedule them for their sleep study. ° °

## 2021-04-22 ENCOUNTER — Telehealth: Payer: Self-pay | Admitting: Neurology

## 2021-04-22 NOTE — Telephone Encounter (Signed)
Patient left a message in regards to scheduling her sleep study. She can be reached at [phone].

## 2021-05-06 ENCOUNTER — Ambulatory Visit (INDEPENDENT_AMBULATORY_CARE_PROVIDER_SITE_OTHER): Payer: BC Managed Care – PPO | Admitting: Neurology

## 2021-05-06 DIAGNOSIS — R0683 Snoring: Secondary | ICD-10-CM

## 2021-05-06 DIAGNOSIS — G4733 Obstructive sleep apnea (adult) (pediatric): Secondary | ICD-10-CM | POA: Diagnosis not present

## 2021-05-06 DIAGNOSIS — Z9189 Other specified personal risk factors, not elsewhere classified: Secondary | ICD-10-CM

## 2021-05-06 DIAGNOSIS — R011 Cardiac murmur, unspecified: Secondary | ICD-10-CM

## 2021-05-06 DIAGNOSIS — R351 Nocturia: Secondary | ICD-10-CM

## 2021-05-06 DIAGNOSIS — E669 Obesity, unspecified: Secondary | ICD-10-CM

## 2021-05-07 ENCOUNTER — Other Ambulatory Visit: Payer: Self-pay | Admitting: Internal Medicine

## 2021-05-07 DIAGNOSIS — R9431 Abnormal electrocardiogram [ECG] [EKG]: Secondary | ICD-10-CM

## 2021-05-07 DIAGNOSIS — R011 Cardiac murmur, unspecified: Secondary | ICD-10-CM

## 2021-05-07 NOTE — Progress Notes (Signed)
See procedure note.

## 2021-05-08 ENCOUNTER — Other Ambulatory Visit: Payer: Self-pay

## 2021-05-08 ENCOUNTER — Ambulatory Visit: Payer: BC Managed Care – PPO

## 2021-05-08 DIAGNOSIS — R011 Cardiac murmur, unspecified: Secondary | ICD-10-CM

## 2021-05-08 DIAGNOSIS — R9431 Abnormal electrocardiogram [ECG] [EKG]: Secondary | ICD-10-CM

## 2021-05-11 ENCOUNTER — Encounter: Payer: Self-pay | Admitting: Neurology

## 2021-05-11 NOTE — Procedures (Signed)
   [facility] TEST (Watch PAT) REPORT  STUDY DATE: 05-06-21  DOB: [birth year]  MRN: [id number]  ORDERING CLINICIAN: Star Age, MD, PhD   REFERRING CLINICIAN: Dr. Thermon Leyland   CLINICAL INFORMATION/HISTORY: 60 year old woman with a history of diabetes, hyperlipidemia, allergies, reflux disease, and obesity, who reports snoring and excessive daytime somnolence.   Epworth sleepiness score: 1/24.  BMI: 37.9 kg/m  Neck Circumference: 14.75"  FINDINGS:   Sleep Summary:   Total Recording Time (hours, min): 8 h 8 min  Total Sleep Time (hours, min):  6 h 42 min   Percent REM (%):    19.38%  Respiratory Indices:   Calculated pAHI (per hour):  19.5/hour        REM pAHI:    31.0/hour      NREM pAHI: 17.0/hour Supine AHI: 22.8 /hour  Oxygen Saturation Statistics:    Oxygen Saturation (%) Mean:   94%  Minimum oxygen saturation (%):     86%  O2 Saturation Range (%): 86-99%   O2 Saturation (minutes) <=88%:  0.4 min  Pulse Rate Statistics:   Pulse Mean (bpm):   73/min    Pulse Range (52-112/min)   IMPRESSION: OSA (obstructive sleep apnea)  RECOMMENDATION:  This home sleep test demonstrates moderate obstructive sleep apnea with a total AHI of 19.5/hour and O2 nadir of 86%.  Intermittent mild to moderate snoring was detected.  Treatment with positive airway pressure is recommended. The patient will be advised to proceed with an autoPAP titration/trial at home for now. A full night titration study may be considered to optimize treatment settings, if needed down the road. Please note that untreated obstructive sleep apnea may carry additional perioperative morbidity. Patients with significant obstructive sleep apnea should receive perioperative PAP therapy and the surgeons and particularly the anesthesiologist should be informed of the diagnosis and the severity of the sleep disordered breathing. The patient should be cautioned not to drive, work at  heights, or operate dangerous or heavy equipment when tired or sleepy. Review and reiteration of good sleep hygiene measures should be pursued with any patient. Other causes of the patient's symptoms, including circadian rhythm disturbances, an underlying mood disorder, medication effect and/or an underlying medical problem cannot be ruled out based on this test. Clinical correlation is recommended. The patient and her referring provider will be notified of the test results. The patient will be seen in follow up in sleep clinic at [facility].  I certify that I have reviewed the raw data recording prior to the issuance of this report in accordance with the standards of the American Academy of Sleep Medicine (AASM).  INTERPRETING PHYSICIAN:   Star Age, MD, PhD  Board Certified in Neurology and Sleep Medicine  [facility] [address] [place], [place] [address] [phone]

## 2021-05-11 NOTE — Addendum Note (Signed)
Addended by: Star Age on: 05/11/2021 04:21 PM   Modules accepted: Orders

## 2021-05-11 NOTE — Progress Notes (Signed)
Patient referred by Dr. Thermon Leyland, seen by me on 03/26/21, patient had a HST on 05/06/21.    Please call and notify the patient that the recent home sleep test showed obstructive sleep apnea in the moderate range. I recommend treatment in the form of autoPAP, which means, that we don't have to bring her in for a sleep study with CPAP, but will let her start using a so called autoPAP machine at home, which is a CPAP-like machine with self-adjusting pressures. We will send the order to a local DME company (of her choice, or as per insurance requirement). The DME representative will fit her with a mask, educate her on how to use the machine, how to put the mask on, etc. I have placed an order in the chart. Please send the order, talk to patient, send report to referring MD. We will need a FU in sleep clinic for 10 weeks post-PAP set up, please arrange that with me or one of our NPs. Also reinforce the need for compliance with treatment. Thanks,   Star Age, MD, PhD [facility])

## 2021-05-12 ENCOUNTER — Telehealth: Payer: Self-pay

## 2021-05-12 NOTE — Telephone Encounter (Signed)
-----   Message from Star Age, MD sent at 05/11/2021  4:21 PM EDT ----- Patient referred by Dr. Thermon Leyland, seen by me on 03/26/21, patient had a HST on 05/06/21.    Please call and notify the patient that the recent home sleep test showed obstructive sleep apnea in the moderate range. I recommend treatment in the form of autoPAP, which means, that we don't have to bring her in for a sleep study with CPAP, but will let her start using a so called autoPAP machine at home, which is a CPAP-like machine with self-adjusting pressures. We will send the order to a local DME company (of her choice, or as per insurance requirement). The DME representative will fit her with a mask, educate her on how to use the machine, how to put the mask on, etc. I have placed an order in the chart. Please send the order, talk to patient, send report to referring MD. We will need a FU in sleep clinic for 10 weeks post-PAP set up, please arrange that with me or one of our NPs. Also reinforce the need for compliance with treatment. Thanks,   Star Age, MD, PhD [facility])

## 2021-05-12 NOTE — Telephone Encounter (Signed)
I called the pt and we discussed the results of the sleep study at length.  Pt declined starting autopap at this time and would like to re group with her Bariatric surgeon. Pt was advised I would send a copy of the results to the surgeon.  Pt was advised of the risks of the leaving OSA untreated, namely stroke, a-fib, heart attack and memory loss.  Pt sts she will call us back if she would like to pursue autopap therapy in the future.   I also asked the pt if she would like a follow up appointment with the Dr to review results and go over treatment and pt declined.  Pt sts she will call back if she would like to pursue a f/u appt.

## 2021-05-12 NOTE — Telephone Encounter (Signed)
Thank you, noted.

## 2021-05-14 ENCOUNTER — Ambulatory Visit: Payer: Self-pay | Admitting: Surgery

## 2021-05-20 ENCOUNTER — Other Ambulatory Visit: Payer: Self-pay

## 2021-05-20 ENCOUNTER — Encounter: Payer: BC Managed Care – PPO | Attending: Surgery | Admitting: Skilled Nursing Facility1

## 2021-05-20 DIAGNOSIS — E119 Type 2 diabetes mellitus without complications: Secondary | ICD-10-CM

## 2021-05-20 DIAGNOSIS — E669 Obesity, unspecified: Secondary | ICD-10-CM | POA: Diagnosis present

## 2021-05-20 DIAGNOSIS — Z794 Long term (current) use of insulin: Secondary | ICD-10-CM | POA: Insufficient documentation

## 2021-05-20 NOTE — Progress Notes (Signed)
Supervised Weight Loss Visit Bariatric Nutrition Education  NUTRITION ASSESSMENT  Pt completed visits.   Pt has cleared nutrition requirements.     Anthropometrics  Start weight at [facility]: 235 lbs (date: 03/11/21)  Weight: 225.3 pounds Height: 66 in BMI: 36.36 kg/m2     Clinical  Medical hx: Type 2 Diabetes, HLD, arthritis causing pain in wrist, knees, hip Medications: Red clover (for hot flashes), Jardiance, Tresiba, sliding scale novolog, vitamin D + calcium, vitamin C, wegovy, meloxicam Labs: HgA1c 7.6 (02/24/21) Notable signs/symptoms: no Any previous deficiencies? No  Lifestyle & Dietary Hx  Pt states she has been doing intermittent fasting stopping at 8pm and start at 12 pm the next day.  Pt states since making all of these changes (increased fruits and vegetables and decreased goodies and chips) she is feeling well and energized.   Estimated daily fluid intake:  oz Supplements:  Current average weekly physical activity:   24-Hr Dietary Recall First Meal: skipped Snack:  Second Meal: 12 slim fast high protein drink + fruit Snack 2pm:  salad: non starchy vegetables + bacon Third Meal: pork chop or ribs + potatoes  Snack: fruit Beverages: water  Estimated Energy Needs Calories: 1500   NUTRITION DIAGNOSIS  Overweight/obesity ([place]-3.3) related to past poor dietary habits and physical inactivity as evidenced by patient w/ planned sleeve gastrectomy  surgery following dietary guidelines for continued weight loss.   NUTRITION INTERVENTION  Nutrition counseling (C-1) and education (E-2) to facilitate bariatric surgery goals.  Pre-Op Goals Progress & New Goals Continue: Make healthy food choices while monitoring portion sizes (add fruit, reduce chips and chocolate which may lead to her eating dinner) Continue: Consume 3 meals per day or try to eat every 3-5 hours (salads for dinner, tomatoes w/s&p) Continue: Physical activity is an important part of a healthy lifestyle  so keep it moving! The goal is to reach 150 minutes of exercise per week, including cardiovascular and weight baring activity.  (walk, chair exercise, aqua exercise) Continue: Aim for 64-100 ounces of FLUID daily (with at least half of fluid intake being plain water)  Continue: Avoid all carbonated beverages (ex: soda, sparkling beverages, cut back on diet mt dew)  NEW: talk with your doctor about going back to ozempic to reduce risk of not qualifying for surgery    Learning Style & Readiness for Change Teaching method utilized: Visual & Auditory  Demonstrated degree of understanding via: Teach Back  Readiness Level: action Barriers to learning/adherence to lifestyle change: none identified    MONITORING & EVALUATION Dietary intake, weekly physical activity, body weight, and pre-op goals   Next Steps  Patient is to return to [facility] for next nutrition appt Pt has completed visits. No further supervised visits required/recomended

## 2021-06-12 DIAGNOSIS — G4733 Obstructive sleep apnea (adult) (pediatric): Secondary | ICD-10-CM | POA: Insufficient documentation

## 2021-06-15 ENCOUNTER — Ambulatory Visit: Payer: Self-pay | Admitting: Student

## 2021-06-15 NOTE — Progress Notes (Signed)
Sent message, via epic in basket, requesting orders in epic from surgeon.  

## 2021-06-22 NOTE — Progress Notes (Addendum)
Anesthesia Review:  PCP:DR Jonathon Jordan clearance dated 02/05/21 on chart  Have requested LOV notes dating from 02/05/21 until present.  SN:[id number] 04/20/2021 OV note on chart.  Cardiologist : none  Chest x-ray : EKG : 02/05/21 on chart  Echo :05/08/21 on chart  Stress test: Cardiac Cath :  Activity level: can do a flight of stairs without difficulty  Sleep Study/ CPAP : awaiting cpap  Fasting Blood Sugar :      / Checks Blood Sugar -- times a day:   Blood Thinner/ Instructions /Last Dose: ASA / Instructions/ Last Dose :   Labs of CMp cbc/diff, PT and hgba1c- dated 06/11/21 on chart  Hgba1c-7.2  Checks glucose with freestyle libre Temp99.9 at preop .  Pt denies any covid symptoms and has not been running any fevers at home per pt.  PT understaNds to call surgeon if develops any symptoms.   Spoke with Diabetic Coordinator , Jenna on 06/24/2021 after paging for 3rd time in regards to preop instructions regardin [facility] prior to surgery  on 07/01/2021.  Diabetic Coordinator stated for pt to not take WEgovy on 06/29/2021 and to wait until after surgery on 07/01/2021 before she takes.  Called pt on 06/24/2021 and informed pt not to take wegovy until after surgery of 07/01/2021.  PT voiced understanding.

## 2021-06-22 NOTE — Progress Notes (Signed)
DUE TO COVID-19 ONLY ONE VISITOR IS ALLOWED TO COME WITH YOU AND STAY IN THE WAITING ROOM ONLY DURING PRE OP AND PROCEDURE DAY OF SURGERY.  2 VISITOR  MAY VISIT WITH YOU AFTER SURGERY IN YOUR PRIVATE ROOM DURING VISITING HOURS ONLY!  YOU NEED TO HAVE A COVID 19 TEST ON_____'@_'$  '@_from'$  8am-3pm _____, THIS TEST MUST BE DONE BEFORE SURGERY,  Covid test is done at [place], [place] [address].  This is a drive thru.  No appt required. Please see map.                 Your procedure is scheduled on:         07/01/2021   Report to [facility] Main  Entrance   Report to admitting at  0900   AM     Call this number if you have problems the morning of surgery [phone]    REMEMBER: NO  SOLID FOOD CANDY OR GUM AFTER MIDNIGHT. CLEAR LIQUIDS UNTIL  0830am        . NOTHING BY MOUTH EXCEPT CLEAR LIQUIDS UNTIL    0830am   . PLEASE FINISH ENSURE DRINK PER SURGEON ORDER  WHICH NEEDS TO BE COMPLETED AT  0830am     .      CLEAR LIQUID DIET   Foods Allowed                                                                    Coffee and tea, regular and decaf                            Fruit ices (not with fruit pulp)                                      Iced Popsicles                                    Carbonated beverages, regular and diet                                    Cranberry, grape and apple juices Sports drinks like Gatorade Lightly seasoned clear broth or consume(fat free) Sugar, honey syrup ___________________________________________________________________      BRUSH YOUR TEETH MORNING OF SURGERY AND RINSE YOUR MOUTH OUT, NO CHEWING GUM CANDY OR MINTS.     Take these medicines the morning of surgery with A SIP OF WATER:     None  DO NOT TAKE ANY DIABETIC MEDICATIONS DAY OF YOUR SURGERY                               You may not have any metal on your body including hair pins and              piercings  Do not wear jewelry, make-up, lotions, powders or  perfumes, deodorant  Do not wear nail polish on your fingernails.  Do not shave  48 hours prior to surgery.              Men may shave face and neck.   Do not bring valuables to the hospital. [place].  Contacts, dentures or bridgework may not be worn into surgery.  Leave suitcase in the car. After surgery it may be brought to your room.     Patients discharged the day of surgery will not be allowed to drive home. IF YOU ARE HAVING SURGERY AND GOING HOME THE SAME DAY, YOU MUST HAVE AN ADULT TO DRIVE YOU HOME AND BE WITH YOU FOR 24 HOURS. YOU MAY GO HOME BY TAXI OR UBER OR ORTHERWISE, BUT AN ADULT MUST ACCOMPANY YOU HOME AND STAY WITH YOU FOR 24 HOURS.  Name and phone number of your driver:  Special Instructions: N/A              Please read over the following fact sheets you were given: _____________________________________________________________________  [facility] - Preparing for Surgery Before surgery, you can play an important role.  Because skin is not sterile, your skin needs to be as free of germs as possible.  You can reduce the number of germs on your skin by washing with CHG (chlorahexidine gluconate) soap before surgery.  CHG is an antiseptic cleaner which kills germs and bonds with the skin to continue killing germs even after washing. Please DO NOT use if you have an allergy to CHG or antibacterial soaps.  If your skin becomes reddened/irritated stop using the CHG and inform your nurse when you arrive at Short Stay. Do not shave (including legs and underarms) for at least 48 hours prior to the first CHG shower.  You may shave your face/neck. Please follow these instructions carefully:  1.  Shower with CHG Soap the night before surgery and the  morning of Surgery.  2.  If you choose to wash your hair, wash your hair first as usual with your  normal  shampoo.  3.  After you shampoo, rinse your hair and body thoroughly  to remove the  shampoo.                           4.  Use CHG as you would any other liquid soap.  You can apply chg directly  to the skin and wash                       Gently with a scrungie or clean washcloth.  5.  Apply the CHG Soap to your body ONLY FROM THE NECK DOWN.   Do not use on face/ open                           Wound or open sores. Avoid contact with eyes, ears mouth and genitals (private parts).                       Wash face,  Genitals (private parts) with your normal soap.             6.  Wash thoroughly, paying special attention to the area where your surgery  will be performed.  7.  Thoroughly rinse your body with  warm water from the neck down.  8.  DO NOT shower/wash with your normal soap after using and rinsing off  the CHG Soap.                9.  Pat yourself dry with a clean towel.            10.  Wear clean pajamas.            11.  Place clean sheets on your bed the night of your first shower and do not  sleep with pets. Day of Surgery : Do not apply any lotions/deodorants the morning of surgery.  Please wear clean clothes to the hospital/surgery center.  FAILURE TO FOLLOW THESE INSTRUCTIONS MAY RESULT IN THE CANCELLATION OF YOUR SURGERY PATIENT SIGNATURE_________________________________  NURSE SIGNATURE__________________________________  ________________________________________________________________________

## 2021-06-23 ENCOUNTER — Other Ambulatory Visit: Payer: Self-pay

## 2021-06-23 ENCOUNTER — Encounter (HOSPITAL_COMMUNITY)
Admission: RE | Admit: 2021-06-23 | Discharge: 2021-06-23 | Disposition: A | Discharge status: Home / Self Care | Payer: BC Managed Care – PPO | Source: Ambulatory Visit | Attending: Orthopedic Surgery | Admitting: Orthopedic Surgery

## 2021-06-23 ENCOUNTER — Encounter (HOSPITAL_COMMUNITY): Payer: Self-pay

## 2021-06-23 DIAGNOSIS — Z01812 Encounter for preprocedural laboratory examination: Secondary | ICD-10-CM | POA: Diagnosis present

## 2021-06-23 HISTORY — DX: Unspecified osteoarthritis, unspecified site: M19.90

## 2021-06-23 HISTORY — DX: Cardiac murmur, unspecified: R01.1

## 2021-06-23 HISTORY — DX: Sleep apnea, unspecified: G47.30

## 2021-06-23 LAB — GLUCOSE, CAPILLARY: Glucose-Capillary: 125 mg/dL — ABNORMAL HIGH (ref 70–99)

## 2021-06-23 LAB — SURGICAL PCR SCREEN
MRSA, PCR: NEGATIVE
Staphylococcus aureus: NEGATIVE

## 2021-06-23 NOTE — Progress Notes (Deleted)
Anesthesia Review:  PCP: DR Jonathon Jordan - clearance  on chart - cannot read date of clearance  Cardiologist : none  Chest x-ray : 02/07/21  EKG :02/05/21  Echo :05/08/21 Stress test: Cardiac Cath :  Activity level: can do a flight of stairs without difficulty  Sleep Study/ CPAP : awaiting cpap  Fasting Blood Sugar :      / Checks Blood Sugar -- times a day:   Blood Thinner/ Instructions /Last Dose: ASA / Instructions/ Last Dose :   06/02/21- labs of CMp, hgba1c-, CBC/DIFF and PT on chart  Hgba1c-7.2 on 06/02/21 Has Freestyle [place] monitor

## 2021-06-23 NOTE — Progress Notes (Signed)
Have paged Diabetic coordinator at [phone] in regards to dosiing of Wegovy preop.

## 2021-06-24 ENCOUNTER — Telehealth: Payer: Self-pay | Admitting: Neurology

## 2021-06-24 ENCOUNTER — Encounter (HOSPITAL_COMMUNITY): Payer: Self-pay | Admitting: Physician Assistant

## 2021-06-24 ENCOUNTER — Encounter: Payer: Self-pay | Admitting: *Deleted

## 2021-06-24 NOTE — Telephone Encounter (Signed)
Spoke to patient.  She wants to proceed with AutoPap.  I relayed to her we will send the order to [facility] [id number].  They will authorize and then call her usually about a week's time to speak to her in regards to her financial responsibility.  I relayed due to shipping delays it may be depending upon availability a little bit until she gets her machine but [facility] would be able to give her more information regarding that.  Once she does get a machine she needs to call us to set up a follow-up appointment (31 to 89)days per insurance compliance.  She verbalized understanding and would call to set that up when she received her machine.  [facility] would make an appointment for her to get her machine ,show her how to use it, supplies, fit for mask.  I will email her a letter with the mentioned information. Answered her questions.

## 2021-06-24 NOTE — Telephone Encounter (Signed)
Letter emailed to [email or web address].  (sent).

## 2021-06-24 NOTE — Telephone Encounter (Signed)
Pt called, have decided to try the CPAP machine. Would like a call from the nurse.

## 2021-06-26 ENCOUNTER — Ambulatory Visit (HOSPITAL_COMMUNITY): Admit: 2021-06-26 | Payer: BC Managed Care – PPO | Admitting: Surgery

## 2021-06-26 ENCOUNTER — Encounter (HOSPITAL_COMMUNITY): Payer: Self-pay

## 2021-06-26 SURGERY — EGD (ESOPHAGOGASTRODUODENOSCOPY)
Anesthesia: Monitor Anesthesia Care

## 2021-07-01 ENCOUNTER — Telehealth: Payer: Self-pay | Admitting: Neurology

## 2021-07-01 ENCOUNTER — Encounter (HOSPITAL_COMMUNITY): Admission: RE | Payer: Self-pay | Source: Home / Self Care

## 2021-07-01 ENCOUNTER — Ambulatory Visit (HOSPITAL_COMMUNITY)
Admission: RE | Admit: 2021-07-01 | Payer: BC Managed Care – PPO | Source: Home / Self Care | Admitting: Orthopedic Surgery

## 2021-07-01 ENCOUNTER — Ambulatory Visit: Payer: BC Managed Care – PPO | Admitting: Podiatry

## 2021-07-01 LAB — TYPE AND SCREEN
ABO/RH(D): A POS
Antibody Screen: NEGATIVE

## 2021-07-01 SURGERY — ARTHROPLASTY, HIP, TOTAL, ANTERIOR APPROACH
Anesthesia: Spinal | Site: Hip | Laterality: Right

## 2021-07-01 NOTE — Telephone Encounter (Signed)
I have reached out to [facility] on this. Order was placed on 05/11/2021 and Lovey Newcomer, [place] sts order was sent on 06/24/2021.

## 2021-07-01 NOTE — Telephone Encounter (Signed)
Felicia Hayden from [facility] called stating they have not received an order in regards to CPAP machine. Felicia Hayden is requesting a call back at [phone].

## 2021-07-01 NOTE — Telephone Encounter (Signed)
Do I have to put another order in for PAP?

## 2021-07-02 ENCOUNTER — Encounter: Payer: Self-pay | Admitting: Podiatrist

## 2021-07-02 ENCOUNTER — Other Ambulatory Visit: Payer: Self-pay

## 2021-07-02 ENCOUNTER — Ambulatory Visit: Payer: BC Managed Care – PPO | Admitting: Podiatrist

## 2021-07-02 DIAGNOSIS — L603 Nail dystrophy: Secondary | ICD-10-CM | POA: Diagnosis not present

## 2021-07-02 DIAGNOSIS — L292 Pruritus vulvae: Secondary | ICD-10-CM | POA: Insufficient documentation

## 2021-07-02 DIAGNOSIS — B351 Tinea unguium: Secondary | ICD-10-CM

## 2021-07-02 DIAGNOSIS — Z309 Encounter for contraceptive management, unspecified: Secondary | ICD-10-CM | POA: Insufficient documentation

## 2021-07-02 DIAGNOSIS — N959 Unspecified menopausal and perimenopausal disorder: Secondary | ICD-10-CM | POA: Insufficient documentation

## 2021-07-02 DIAGNOSIS — B009 Herpesviral infection, unspecified: Secondary | ICD-10-CM | POA: Insufficient documentation

## 2021-07-02 NOTE — Progress Notes (Signed)
Chief Complaint  Patient presents with   Nail Problem    BL hallux discoloration x couple mos - no thickening/pain/redness/swelling -no injury tx: none    Diabetes    FBS: 106 a1C; 7.2 PCP: Cheron Schaumann x June     HPI: Patient is 60 y.o. female who presents today for darkening and discoloration of bilateral hallux nails and fourth toenails bilateral.  She denies any injury to the feet.  She is diabetic and denies any numbness or tingling to the feet.    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     azelastine (ASTELIN) 0.1 % nasal spray azelastine 137 mcg (0.1 %) nasal spray aerosol  Spray 2 sprays twice a day by intranasal route.     azelastine (OPTIVAR) 0.05 % ophthalmic solution      Azelastine HCl 0.15 % SOLN Place 1 spray into both nostrils 2 (two) times daily.     calcium-vitamin D (OSCAL WITH D) 500-200 MG-UNIT per tablet Take 1 tablet by mouth.     clotrimazole-betamethasone (LOTRISONE) cream clotrimazole-betamethasone 1 %-0.05 % topical cream  APPLY TOPICALLY TO THE AFFECTED AND SURROUNDING AREAS TWICE DAILY IN THE MORNING AND IN THE EVENING FOR 7 DAYS     Continuous Blood Gluc Sensor (FREESTYLE LIBRE 14 DAY SENSOR) MISC  CHANGE SENSOR EVERY 14 DAYS     diphenhydrAMINE (BENADRYL) 25 MG tablet Take 25 mg by mouth every 6 (six) hours as needed.     fluticasone (FLONASE) 50 MCG/ACT nasal spray Place 2 sprays into the nose daily.     Ibuprofen (MOTRIN IB PO) Motrin     insulin aspart (NOVOLOG FLEXPEN) 100 UNIT/ML FlexPen Inject 25 Units into the skin 3 (three) times daily with meals. And nano pen needles 3/day (Patient taking differently: Inject 25 Units into the skin 3 (three) times daily with meals. And nano pen needles 3/day Insulin is based on sliding scale and carb based per pt) 25 pen 3   JARDIANCE 25 MG TABS tablet Take 25 mg by mouth daily.     levocetirizine (XYZAL) 5 MG tablet Take 5 mg by mouth daily.     meloxicam (MOBIC) 15 MG tablet Take 15 mg by mouth daily.     methocarbamol (ROBAXIN) 500 MG tablet Take 1 tablet by mouth every 6 (six) hours as needed.     pseudoephedrine (SUDAFED) 30 MG tablet Take 30 mg by mouth every 4 (four) hours as needed for congestion.      Semaglutide-Weight Management (WEGOVY) 2.4 MG/0.75ML SOAJ Inject 2.4 mg into the skin. PT takes on Monday per pt     traMADol (ULTRAM) 50 MG tablet Take 50 mg by mouth every 6 (six) hours as needed.     TRESIBA FLEXTOUCH 100 UNIT/ML  FlexTouch Pen 20 Units daily. With breakfast meal per pt     No current facility-administered medications on file prior to visit.    No Known Allergies  Review of Systems No fevers, chills, nausea, muscle aches, no difficulty breathing, no calf pain, no chest pain or shortness of breath.   Physical Exam  GENERAL APPEARANCE: Alert, conversant. Appropriately groomed. No acute distress.   VASCULAR: Pedal pulses palpable DP and PT bilateral.  Capillary refill time is immediate to all digits,  Proximal to distal cooling it warm to warm.  Digital perfusion adequate.   NEUROLOGIC: sensation is intact to 5.07 monofilament at 5/5 sites bilateral.  Light touch is intact bilateral, vibratory sensation intact  bilateral  MUSCULOSKELETAL: acceptable muscle strength, tone and stability bilateral.  No gross boney pedal deformities noted.  No pain, crepitus or limitation noted with foot and ankle range of motion bilateral.   DERMATOLOGIC: skin is warm, supple, and dry.  No open lesions noted.  No rash, no pre ulcerative lesions. Hallux  nails have an area of darkening on the central aspect of the nail-  there is proximal clearing consistent with nail microtrauma or bruising of the underlying nail bed.  Right fourth toenail has a darkening discoloration as well with some thickness at the distal aspect-     Assessment   Onychomycosis vs. onychodystrophy  Plan  Recommended taking a sample of the right fourth toenail to send for culture.  The pattern of darkening appears to favor microtrauma but I want to be sure so am sending out the sample.  Will notify the patient when the culture result is available and will decided on treatment options at that time.

## 2021-07-02 NOTE — Patient Instructions (Signed)
I am sending a nail sample to a lab ([facility])  - when the result comes iin I will call and we can decide if treatment is indicated.

## 2021-07-06 NOTE — Telephone Encounter (Signed)
Received feedback from [place] with [facility]  Pt is scheduled for set up date on 07/07/2021.

## 2021-07-09 ENCOUNTER — Telehealth: Payer: Self-pay | Admitting: Neurology

## 2021-07-09 ENCOUNTER — Encounter: Payer: Self-pay | Admitting: Neurology

## 2021-07-09 NOTE — Telephone Encounter (Signed)
error 

## 2021-07-09 NOTE — Telephone Encounter (Signed)
Lvm for her to call us back to schedule

## 2021-07-28 NOTE — Telephone Encounter (Signed)
ERROR

## 2021-08-11 ENCOUNTER — Other Ambulatory Visit: Payer: Self-pay

## 2021-08-11 ENCOUNTER — Ambulatory Visit: Payer: BC Managed Care – PPO | Admitting: Neurology

## 2021-08-11 VITALS — BP 135/74 | HR 83 | Ht 66.0 in | Wt 203.6 lb

## 2021-08-11 DIAGNOSIS — G4733 Obstructive sleep apnea (adult) (pediatric): Secondary | ICD-10-CM | POA: Diagnosis not present

## 2021-08-11 DIAGNOSIS — R634 Abnormal weight loss: Secondary | ICD-10-CM | POA: Diagnosis not present

## 2021-08-11 DIAGNOSIS — Z9989 Dependence on other enabling machines and devices: Secondary | ICD-10-CM

## 2021-08-11 DIAGNOSIS — Z789 Other specified health status: Secondary | ICD-10-CM | POA: Diagnosis not present

## 2021-08-11 NOTE — Patient Instructions (Addendum)
Please continue using your autoPAP regularly. While your insurance requires that you use PAP at least 4 hours each night on 70% of the nights, I recommend, that you not skip any nights and use it throughout the night if you can. Getting used to PAP and staying with the treatment long term does take time and patience and discipline. Untreated obstructive sleep apnea when it is moderate to severe can have an adverse impact on cardiovascular health and raise her risk for heart disease, arrhythmias, hypertension, congestive heart failure, stroke and diabetes. Untreated obstructive sleep apnea causes sleep disruption, nonrestorative sleep, and sleep deprivation. This can have an impact on your day to day functioning and cause daytime sleepiness and impairment of cognitive function, memory loss, mood disturbance, and problems focussing. Using PAP regularly can improve these symptoms.  Please try to be consistent with your AutoPap and try to keep your machine on for about 7 to 8 hours at night.  Your average usage is currently just about 4 hours.  You have fulfilled insurance criteria for compliance.  We can monitor your symptoms and your download in the next 6 months, please follow-up to see one of our nurse practitioners routinely in 6 months and we can consider retesting with a home sleep test at the time.  You have indeed lost about 30 pounds since I first met you.  With continued weight loss, your sleep apnea may very well improve.  Alternative treatment options may include a dental device or inspire, an implanted nerve stimulator.

## 2021-08-11 NOTE — Progress Notes (Signed)
Subjective:    Patient ID: Felicia Hayden is a 60 y.o. female.  HPI    Interim history:   Felicia Hayden is a 60 year old right-handed woman with an underlying medical history of diabetes, hyperlipidemia, allergies, reflux disease, and obesity, who presents for follow-up consultation of her obstructive sleep apnea after interim testing and starting AutoPap therapy.  The patient is unaccompanied today.  I first met her on 03/26/2021 at the request of Dr. Thermon Leyland.  She was felt to be at risk for obstructive sleep apnea and was advised to proceed with a sleep test.  She had a home sleep test on 05/06/2021 which indicated moderate obstructive sleep apnea, with an AHI of 19.5/h, O2 nadir 86%.  Intermittent mild to moderate snoring was detected.  She was advised to start AutoPap therapy.  Her set up date was 07/07/2021.  She has a Resvent machine.  Today, 08/11/2021: I reviewed her AutoPap compliance data from 07/07/2021 through 08/11/2021, which is a total of 35 days, during which time she used her machine 35 days with percent use days greater than 4 hours at 78%, indicating adequate compliance with an average usage for days on treatment of 4.1 hours.  Average AHI is suboptimal at 8.1/h, pressure for the 95th percentile right at 8 cm with a range of 5 to 11 cm, leak on the low side now but was high in the first week.  Current 95th percentile of leak at 1.7 L/min.  She has no significant central events.  She reports still adjusting to treatment.  It has been just a little over a month.  She had difficulty tolerating the mask, tried a full facemask but mostly alternates now between the nasal pillows and the full facemask, most of the time she now uses nasal pillows.  She had significant leakage from the mouth in the first week, she had a higher AHI on average in the first week when she was still struggling with adjusting to treatment.  She has lost about 30 pounds since we first met in May.  She is not  considering weight loss surgery any longer at this time.  She is pursuing right hip replacement and in fact has an appointment for surgery soon.  This will be done outpatient.  Her surgeon is aware of her sleep apnea diagnosis.  She reports that she will not be kept overnight.  While she has not had any telltale response to AutoPap therapy.  She is motivated to continue with treatment and also continue with weight loss.  We talked about alternative treatment options such as a dental device or inspire today.   The patient's allergies, current medications, family history, past medical history, past social history, past surgical history and problem list were reviewed and updated as appropriate.   Previously:   03/26/21: (She) reports no snoring and does not report (corrections made on 05/12/2021) excessive daytime somnolence.  She has a history of laparoscopic adjustable gastric band placement.  Her band had to deflated due to dysphagia and severe reflux.  I reviewed your office note from 01/22/2021.  She is being considered for sleeve gastrectomy.  Her Epworth sleepiness score is 1/24, fatigue severity score is 13 out of 63.  She reports having had a sleep study some 10 years ago which was negative for sleep apnea at the time per her report.  She denies recurrent morning headaches.  She has nocturia about once per average night, bedtime is generally around 11 and rise time around 8.  She works at [employer].  She is a non-smoker and does not use alcohol, does not drink caffeine on a daily basis.  She has right hip pain, recent MRI of the right hip showed advanced arthritis.  She takes tramadol and Robaxin for pain.  She is followed by [facility].  She lives with her husband.  She has a TV in her bedroom and turns it off before falling asleep.  She has no family history of sleep apnea.  She had a tonsillectomy when she was in college.    Her Past Medical History Is Significant For: Past Medical History:   Diagnosis Date   Abnormal Pap smear    Arthritis    Diabetes mellitus without complication (HCC)    Heart murmur    History of abnormal Pap smear 1978   S/P cryotherapy   Hyperlipidemia    Sleep apnea     Her Past Surgical History Is Significant For: Past Surgical History:  Procedure Laterality Date   DILATION AND CURETTAGE OF UTERUS  03/16/1999   LAPAROSCOPIC GASTRIC BANDING     lasikk eye surgery      TONSILLECTOMY  11/15/1981    Her Family History Is Significant For: Family History  Problem Relation Age of Onset   Hypertension Mother    Cancer Mother        ovarian; uterine   Ovarian cancer Mother    Thyroid disease Mother    Hypothyroidism Mother    Cancer Sister        skin   Cancer Sister        lung   Pneumonia Father    Heart attack Maternal Grandmother    Diabetes Neg Hx    Allergic rhinitis Neg Hx    Angioedema Neg Hx    Asthma Neg Hx    Atopy Neg Hx    Eczema Neg Hx    Immunodeficiency Neg Hx    Urticaria Neg Hx     Her Social History Is Significant For: Social History   Socioeconomic History   Marital status: Married    Spouse name: Not on file   Number of children: Not on file   Years of education: Not on file   Highest education level: Not on file  Occupational History   Not on file  Tobacco Use   Smoking status: Former   Smokeless tobacco: Never  Vaping Use   Vaping Use: Never used  Substance and Sexual Activity   Alcohol use: No   Drug use: No   Sexual activity: Yes    Birth control/protection: Pill    Comment: zarah  Other Topics Concern   Not on file  Social History Narrative   Not on file   Social Determinants of Health   Financial Resource Strain: Not on file  Food Insecurity: Not on file  Transportation Needs: Not on file  Physical Activity: Not on file  Stress: Not on file  Social Connections: Not on file    Her Allergies Are:  No Known Allergies:   Her Current Medications Are:  Outpatient Encounter  Medications as of 08/11/2021  Medication Sig   acetaminophen (TYLENOL) 325 MG tablet Take 325 mg by mouth every 6 (six) hours as needed.   azelastine (ASTELIN) 0.1 % nasal spray azelastine 137 mcg (0.1 %) nasal spray aerosol  Spray 2 sprays twice a day by intranasal route.   azelastine (OPTIVAR) 0.05 % ophthalmic solution    Azelastine HCl 0.15 % SOLN Place 1 spray  into both nostrils 2 (two) times daily.   calcium-vitamin D (OSCAL WITH D) 500-200 MG-UNIT per tablet Take 1 tablet by mouth.   clotrimazole-betamethasone (LOTRISONE) cream clotrimazole-betamethasone 1 %-0.05 % topical cream  APPLY TOPICALLY TO THE AFFECTED AND SURROUNDING AREAS TWICE DAILY IN THE MORNING AND IN THE EVENING FOR 7 DAYS   Continuous Blood Gluc Sensor (FREESTYLE LIBRE 14 DAY SENSOR) MISC CHANGE SENSOR EVERY 14 DAYS   fluticasone (FLONASE) 50 MCG/ACT nasal spray Place 2 sprays into the nose daily.   Ibuprofen (MOTRIN IB PO) Motrin   insulin aspart (NOVOLOG FLEXPEN) 100 UNIT/ML FlexPen Inject 25 Units into the skin 3 (three) times daily with meals. And nano pen needles 3/day (Patient taking differently: Inject into the skin 3 (three) times daily with meals. And nano pen needles 3/day Insulin is based on sliding scale and carb based per pt)   JARDIANCE 25 MG TABS tablet Take 25 mg by mouth daily.   levocetirizine (XYZAL) 5 MG tablet Take 5 mg by mouth daily.   pseudoephedrine (SUDAFED) 30 MG tablet Take 30 mg by mouth every 4 (four) hours as needed for congestion.    Semaglutide-Weight Management (WEGOVY) 2.4 MG/0.75ML SOAJ Inject 2.4 mg into the skin. PT takes on Monday per pt   traMADol (ULTRAM) 50 MG tablet Take 50 mg by mouth every 6 (six) hours as needed.   TRESIBA FLEXTOUCH 100 UNIT/ML FlexTouch Pen 20 Units daily. With breakfast meal per pt   meloxicam (MOBIC) 15 MG tablet Take 15 mg by mouth daily. (Patient not taking: Reported on 08/11/2021)   [DISCONTINUED] diphenhydrAMINE (BENADRYL) 25 MG tablet Take 25 mg by  mouth every 6 (six) hours as needed.   [DISCONTINUED] methocarbamol (ROBAXIN) 500 MG tablet Take 1 tablet by mouth every 6 (six) hours as needed.   No facility-administered encounter medications on file as of 08/11/2021.  :  Review of Systems:  Out of a complete 14 point review of systems, all are reviewed and negative with the exception of these symptoms as listed below:  Review of Systems  Neurological:        Initial cpap follow up Felicia Hayden set up 07-07-21.    Objective:  Neurological Exam  Physical Exam Physical Examination:   Vitals:   08/11/21 1258  BP: 135/74  Pulse: 83    General Examination: The patient is a very pleasant 60 y.o. female in no acute distress. She appears well-developed and well-nourished and well groomed.   HEENT: Normocephalic, atraumatic, pupils are equal, round and reactive to light, extraocular tracking is good without limitation to gaze excursion or nystagmus noted. Hearing is grossly intact. Face is symmetric with normal facial animation. Speech is clear with no dysarthria noted. There is no hypophonia. There is no lip, neck/head, jaw or voice tremor. Neck is supple with full range of passive and active motion. There are no carotid bruits on auscultation. Oropharynx exam reveals: mild mouth dryness, adequate dental hygiene.  Mallampati is class I.  Tonsils absent.  Neck circumference of 14 three-quarter inches.  She has a moderate overbite.  Tongue protrudes centrally and palate elevates symmetrically.    Chest: Clear to auscultation without wheezing, rhonchi or crackles noted.   Heart: S1+S2+0, regular and normal with a mild, 1/6 systolic murmur noted.      Abdomen: Soft, non-tender and non-distended with normal bowel sounds appreciated on auscultation.   Extremities: There is no pitting edema in the distal lower extremities bilaterally.    Skin: Warm and dry without trophic  changes noted.    Musculoskeletal: exam reveals decreased range of motion  right hip.  She reports hip pain.    Neurologically:  Mental status: The patient is awake, alert and oriented in all 4 spheres. Her immediate and remote memory, attention, language skills and fund of knowledge are appropriate. There is no evidence of aphasia, agnosia, apraxia or anomia. Speech is clear with normal prosody and enunciation. Thought process is linear. Mood is normal and affect is normal.  Cranial nerves II - XII are as described above under HEENT exam.  Motor exam: Normal bulk, strength and tone is noted. There is no tremor. Fine motor skills and coordination: grossly intact.  Cerebellar testing: No dysmetria or intention tremor. There is no truncal or gait ataxia.  Sensory exam: intact to light touch in the upper and lower extremities.  Gait, station and balance: She stands with difficulty.  No walking aid.  She walks with a limp on the right.     Assessment and Plan:    In summary, Felicia Hayden is a very pleasant 60 year old female with an underlying medical history of diabetes, hyperlipidemia, allergies, reflux disease, and obesity, who presents for follow-up consultation of her obstructive sleep apnea which was deemed in the moderate range by number of events when she had home sleep testing on 05/06/2021.  She established treatment with AutoPap therapy just a little over a month ago.  She is compliant and still adjusting to treatment.  She is advised that he can take up to 3 months and sometimes even longer to fully adjust to treatment and reap full benefit from it.  She is motivated to continue with treatment.  She has lost quite a bit of weight since we first met and is pursuing ongoing weight loss.  She decided not to pursue bariatric surgery.  She had gastric banding in the past.  She was considering gastric sleeve with her general surgeon.  She is now pursuing her right hip replacement.  We talked about possible alternative treatment options including pursuing weight  loss, a dental device or inspire in the future if the need arises.  She is for now agreeable to continuing with AutoPap therapy.  We mutually agreed to keep her pressure settings the same even though her AHI is right around 8/h currently.  She is advised to follow-up routinely to see one of our nurse practitioners in 6 months.  We may consider repeating her home sleep test then if she has either maintained or lost further weight.  She is advised to call us or email [place] through [place] with any interim questions or concerns, we reviewed her home sleep test results and reviewed her compliance data in detail today.  I answered all her questions today and she was in agreement with the plan.   I again explained in particular the risks and ramifications of untreated moderate to severe OSA, especially with respect to developing cardiovascular disease down the Road, including congestive heart failure, difficult to treat hypertension, cardiac arrhythmias, or stroke. Even type 2 diabetes has, in part, been linked to untreated OSA. Symptoms of untreated OSA include daytime sleepiness, memory problems, mood irritability and mood disorder such as depression and anxiety, lack of energy, as well as recurrent headaches, especially morning headaches.  I spent 30 minutes in total face-to-face time and in reviewing records during pre-charting, more than 50% of which was spent in counseling and coordination of care, reviewing test results, reviewing medications and treatment regimen and/or in  discussing or reviewing the diagnosis of OSA, the prognosis and treatment options. Pertinent laboratory and imaging test results that were available during this visit with the patient were reviewed by me and considered in my medical decision making (see chart for details).

## 2021-08-23 ENCOUNTER — Inpatient Hospital Stay (HOSPITAL_BASED_OUTPATIENT_CLINIC_OR_DEPARTMENT_OTHER)
Admission: EM | Admit: 2021-08-23 | Discharge: 2021-08-28 | DRG: 388 | Disposition: A | Discharge status: Home Health | Payer: BC Managed Care – PPO | Attending: Internal Medicine | Admitting: Internal Medicine

## 2021-08-23 ENCOUNTER — Encounter (HOSPITAL_BASED_OUTPATIENT_CLINIC_OR_DEPARTMENT_OTHER): Payer: Self-pay | Admitting: *Deleted

## 2021-08-23 ENCOUNTER — Emergency Department (HOSPITAL_BASED_OUTPATIENT_CLINIC_OR_DEPARTMENT_OTHER): Payer: BC Managed Care – PPO

## 2021-08-23 ENCOUNTER — Other Ambulatory Visit: Payer: Self-pay

## 2021-08-23 DIAGNOSIS — E119 Type 2 diabetes mellitus without complications: Secondary | ICD-10-CM

## 2021-08-23 DIAGNOSIS — R911 Solitary pulmonary nodule: Secondary | ICD-10-CM | POA: Diagnosis present

## 2021-08-23 DIAGNOSIS — Z20822 Contact with and (suspected) exposure to covid-19: Secondary | ICD-10-CM | POA: Diagnosis present

## 2021-08-23 DIAGNOSIS — Z87891 Personal history of nicotine dependence: Secondary | ICD-10-CM

## 2021-08-23 DIAGNOSIS — Z6832 Body mass index (BMI) 32.0-32.9, adult: Secondary | ICD-10-CM

## 2021-08-23 DIAGNOSIS — Z79899 Other long term (current) drug therapy: Secondary | ICD-10-CM

## 2021-08-23 DIAGNOSIS — K567 Ileus, unspecified: Secondary | ICD-10-CM | POA: Diagnosis not present

## 2021-08-23 DIAGNOSIS — Z7982 Long term (current) use of aspirin: Secondary | ICD-10-CM

## 2021-08-23 DIAGNOSIS — M1990 Unspecified osteoarthritis, unspecified site: Secondary | ICD-10-CM | POA: Diagnosis present

## 2021-08-23 DIAGNOSIS — Z9884 Bariatric surgery status: Secondary | ICD-10-CM

## 2021-08-23 DIAGNOSIS — E1110 Type 2 diabetes mellitus with ketoacidosis without coma: Secondary | ICD-10-CM | POA: Diagnosis present

## 2021-08-23 DIAGNOSIS — Z794 Long term (current) use of insulin: Secondary | ICD-10-CM

## 2021-08-23 DIAGNOSIS — G4733 Obstructive sleep apnea (adult) (pediatric): Secondary | ICD-10-CM | POA: Diagnosis present

## 2021-08-23 DIAGNOSIS — K3189 Other diseases of stomach and duodenum: Secondary | ICD-10-CM

## 2021-08-23 DIAGNOSIS — R109 Unspecified abdominal pain: Secondary | ICD-10-CM | POA: Diagnosis present

## 2021-08-23 DIAGNOSIS — E669 Obesity, unspecified: Secondary | ICD-10-CM | POA: Diagnosis present

## 2021-08-23 DIAGNOSIS — R918 Other nonspecific abnormal finding of lung field: Secondary | ICD-10-CM | POA: Diagnosis present

## 2021-08-23 DIAGNOSIS — E870 Hyperosmolality and hypernatremia: Secondary | ICD-10-CM | POA: Diagnosis present

## 2021-08-23 DIAGNOSIS — E876 Hypokalemia: Secondary | ICD-10-CM | POA: Diagnosis present

## 2021-08-23 DIAGNOSIS — E860 Dehydration: Secondary | ICD-10-CM

## 2021-08-23 DIAGNOSIS — K219 Gastro-esophageal reflux disease without esophagitis: Secondary | ICD-10-CM | POA: Diagnosis present

## 2021-08-23 DIAGNOSIS — R000 Tachycardia, unspecified: Secondary | ICD-10-CM | POA: Diagnosis present

## 2021-08-23 DIAGNOSIS — D75839 Thrombocytosis, unspecified: Secondary | ICD-10-CM | POA: Diagnosis present

## 2021-08-23 DIAGNOSIS — Z8249 Family history of ischemic heart disease and other diseases of the circulatory system: Secondary | ICD-10-CM

## 2021-08-23 DIAGNOSIS — D649 Anemia, unspecified: Secondary | ICD-10-CM | POA: Diagnosis present

## 2021-08-23 DIAGNOSIS — Z96641 Presence of right artificial hip joint: Secondary | ICD-10-CM | POA: Diagnosis present

## 2021-08-23 DIAGNOSIS — Z7985 Long-term (current) use of injectable non-insulin antidiabetic drugs: Secondary | ICD-10-CM

## 2021-08-23 DIAGNOSIS — R1110 Vomiting, unspecified: Secondary | ICD-10-CM | POA: Diagnosis present

## 2021-08-23 DIAGNOSIS — R52 Pain, unspecified: Secondary | ICD-10-CM

## 2021-08-23 DIAGNOSIS — Z7984 Long term (current) use of oral hypoglycemic drugs: Secondary | ICD-10-CM

## 2021-08-23 DIAGNOSIS — E785 Hyperlipidemia, unspecified: Secondary | ICD-10-CM | POA: Diagnosis present

## 2021-08-23 DIAGNOSIS — Z79891 Long term (current) use of opiate analgesic: Secondary | ICD-10-CM

## 2021-08-23 LAB — CBC
HCT: 40.9 % (ref 36.0–46.0)
Hemoglobin: 12.5 g/dL (ref 12.0–15.0)
MCH: 26.6 pg (ref 26.0–34.0)
MCHC: 30.6 g/dL (ref 30.0–36.0)
MCV: 87 fL (ref 80.0–100.0)
Platelets: 497 10*3/uL — ABNORMAL HIGH (ref 150–400)
RBC: 4.7 MIL/uL (ref 3.87–5.11)
RDW: 14.3 % (ref 11.5–15.5)
WBC: 13.2 10*3/uL — ABNORMAL HIGH (ref 4.0–10.5)
nRBC: 0 % (ref 0.0–0.2)

## 2021-08-23 LAB — URINALYSIS, ROUTINE W REFLEX MICROSCOPIC
Bilirubin Urine: NEGATIVE
Glucose, UA: >1000 mg/dL — AB
Ketones, ur: >80 mg/dL — AB
Leukocytes,Ua: NEGATIVE
Nitrite: NEGATIVE
Protein, ur: 30 mg/dL — AB
Specific Gravity, Urine: 1.017 (ref 1.005–1.030)
pH: 5 (ref 5.0–8.0)

## 2021-08-23 LAB — RESP PANEL BY RT-PCR (FLU A&B, COVID) ARPGX2
Influenza A by PCR: NEGATIVE
Influenza B by PCR: NEGATIVE
SARS Coronavirus 2 by RT PCR: NEGATIVE

## 2021-08-23 LAB — CBG MONITORING, ED: Glucose-Capillary: 167 mg/dL — ABNORMAL HIGH (ref 70–99)

## 2021-08-23 LAB — LACTIC ACID, PLASMA
Lactic Acid, Venous: 2.5 mmol/L (ref 0.5–1.9)
Lactic Acid, Venous: 1.1 mmol/L (ref 0.5–1.9)

## 2021-08-23 MED ORDER — MORPHINE SULFATE (PF) 4 MG/ML IV SOLN
6.0000 mg | Freq: Once | INTRAVENOUS | Status: AC
  Administered 2021-08-23: 6 mg via INTRAVENOUS
  Filled 2021-08-23: qty 2

## 2021-08-23 MED ORDER — SODIUM CHLORIDE 0.9 % IV BOLUS
1000.0000 mL | Freq: Once | INTRAVENOUS | Status: AC
  Administered 2021-08-23: 1000 mL via INTRAVENOUS

## 2021-08-23 MED ORDER — IOHEXOL 350 MG/ML SOLN
100.0000 mL | Freq: Once | INTRAVENOUS | Status: AC | PRN
  Administered 2021-08-23: 100 mL via INTRAVENOUS

## 2021-08-23 MED ORDER — ONDANSETRON HCL 4 MG/2ML IJ SOLN
4.0000 mg | Freq: Once | INTRAMUSCULAR | Status: AC | PRN
  Administered 2021-08-23: 4 mg via INTRAVENOUS

## 2021-08-23 MED ORDER — ONDANSETRON HCL 4 MG/2ML IJ SOLN
INTRAMUSCULAR | Status: AC
  Filled 2021-08-23: qty 2

## 2021-08-23 MED ORDER — MORPHINE SULFATE (PF) 4 MG/ML IV SOLN
4.0000 mg | Freq: Once | INTRAVENOUS | Status: AC
  Administered 2021-08-23: 4 mg via INTRAVENOUS
  Filled 2021-08-23: qty 1

## 2021-08-23 MED ORDER — MORPHINE SULFATE (PF) 4 MG/ML IV SOLN
4.0000 mg | INTRAVENOUS | Status: DC | PRN
  Administered 2021-08-24: 4 mg via INTRAVENOUS
  Filled 2021-08-23: qty 1

## 2021-08-23 NOTE — ED Notes (Signed)
Pt took hydrocodone today and helped hip pain but not epigastric pain.

## 2021-08-23 NOTE — ED Notes (Signed)
Informed Gertie Fey - PA and Levada Dy - RN of pt's elevated HR

## 2021-08-23 NOTE — ED Notes (Signed)
Patient transported to CT 

## 2021-08-23 NOTE — ED Notes (Signed)
Pt was concerned about sugar, tech checked CBG. WNL. Informed patient that she has had three bags of fluid.

## 2021-08-23 NOTE — ED Notes (Signed)
Gave patient gingerale/cranberry juice and peanut butter crackers.

## 2021-08-23 NOTE — ED Notes (Signed)
RN ambulated pt 25 feet to bathroom ,upon return patient became dizzy and diaphoretic. Pts heart rate was 156, ekg performed and given to PA Tron,pts bp 171/64. Assisted to bed and monitored.

## 2021-08-23 NOTE — ED Provider Notes (Signed)
[facility] EMERGENCY DEPT Provider Note   CSN: [id number] Arrival date & time: 08/23/21  1112     History Chief Complaint  Patient presents with   Abdominal Pain   Emesis    Felicia Hayden is a 60 y.o. female.  The history is provided by the patient and medical records. No language interpreter was used.  Abdominal Pain Associated symptoms: vomiting   Emesis Associated symptoms: abdominal pain    60 year old female with significant history of diabetes, hyperlipidemia, recently had total right hip replacement on September 30 who presents complaining of abdominal pain.  Patient states since her surgery, she has had trouble with constipation while taking narcotic pain medication.  Her constipation did improve and she did had a decent sized bowel movement yesterday.  However, for the past several days she has been having epigastric discomfort described as a cramping sensation with associated nausea and the feeling of indigestion.  She also complaining of sharp shooting pain to bilateral lower back radiates towards her legs after the anesthesia wore off.  This sharp shooting pain is sporadic but intense causing difficulty sleeping.  She felt that her hip is doing fine and able to ambulate without significant discomfort or pain.  She denies fever chills shortness of breath or cough.  No prior abdominal surgery including laparoscopic gastric banding in the past.  Her orthopedist is Dr. 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   Perimenopausal disorder 07/02/2021   Pruritus of vulva 07/02/2021   Obstructive sleep  apnea syndrome 06/12/2021   Scapulalgia 10/15/2020   Pain in joint of right shoulder 09/26/2020   Scoliosis deformity of spine 11/28/2018   Osteoarthritis of first carpometacarpal joint of right hand 10/06/2018   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 03/07/2018   Lumbar radiculopathy 03/07/2018   Vitamin D deficiency 03/07/2018   Diabetes ([place]) 08/29/2017   Uterine leiomyoma 11/16/2011    Past Surgical History:  Procedure Laterality Date   DILATION AND CURETTAGE OF UTERUS  03/16/1999   HIP SURGERY Right    LAPAROSCOPIC GASTRIC BANDING     lasikk eye surgery      TONSILLECTOMY  11/15/1981     OB History     Gravida  4   Para  2   Term      Preterm      AB  2   Living  2      SAB  2   IAB      Ectopic      Multiple      Live Births              Family History  Problem Relation Age of Onset   Hypertension Mother    Cancer Mother        ovarian; uterine   Ovarian cancer Mother    Thyroid disease Mother    Hypothyroidism Mother    Cancer Sister        skin   Cancer Sister        lung   Pneumonia Father    Heart  attack Maternal Grandmother    Diabetes Neg Hx    Allergic rhinitis Neg Hx    Angioedema Neg Hx    Asthma Neg Hx    Atopy Neg Hx    Eczema Neg Hx    Immunodeficiency Neg Hx    Urticaria Neg Hx     Social History   Tobacco Use   Smoking status: Former   Smokeless tobacco: Never  Scientific laboratory technician Use: Never used  Substance Use Topics   Alcohol use: No   Drug use: No    Home Medications Prior to Admission medications   Medication Sig Start Date End Date Taking? Authorizing Provider  aspirin 325 MG tablet Take 325 mg by mouth daily.   Yes [provider]  JARDIANCE 25 MG TABS tablet Take 25 mg by mouth daily. 01/05/20  Yes [provider]  levocetirizine (XYZAL) 5 MG tablet Take 5 mg by mouth daily. 04/21/21  Yes [provider]  Semaglutide-Weight Management (WEGOVY) 2.4 MG/0.75ML SOAJ  Inject 2.4 mg into the skin. PT takes on Monday per pt   Yes [provider]  traMADol (ULTRAM) 50 MG tablet Take 50 mg by mouth every 6 (six) hours as needed. 03/16/21  Yes [provider]  acetaminophen (TYLENOL) 325 MG tablet Take 325 mg by mouth every 6 (six) hours as needed.    [provider]  azelastine (ASTELIN) 0.1 % nasal spray azelastine 137 mcg (0.1 %) nasal spray aerosol  Spray 2 sprays twice a day by intranasal route.    [provider]  azelastine (OPTIVAR) 0.05 % ophthalmic solution  04/12/18   [provider]  Azelastine HCl 0.15 % SOLN Place 1 spray into both nostrils 2 (two) times daily. 01/07/20   [provider]  calcium-vitamin D (OSCAL WITH D) 500-200 MG-UNIT per tablet Take 1 tablet by mouth.    [provider]  clotrimazole-betamethasone (LOTRISONE) cream clotrimazole-betamethasone 1 %-0.05 % topical cream  APPLY TOPICALLY TO THE AFFECTED AND SURROUNDING AREAS TWICE DAILY IN THE MORNING AND IN THE EVENING FOR 7 DAYS    [provider]  Continuous Blood Gluc Sensor (FREESTYLE LIBRE 14 DAY SENSOR) MISC CHANGE SENSOR EVERY 14 DAYS 06/25/21   [provider]  fluticasone (FLONASE) 50 MCG/ACT nasal spray Place 2 sprays into the nose daily.    [provider]  Ibuprofen (MOTRIN IB PO) Motrin    [provider]  insulin aspart (NOVOLOG FLEXPEN) 100 UNIT/ML FlexPen Inject 25 Units into the skin 3 (three) times daily with meals. And nano pen needles 3/day Patient taking differently: Inject into the skin 3 (three) times daily with meals. And nano pen needles 3/day Insulin is based on sliding scale and carb based per pt 12/02/17   Renato Shin, MD  meloxicam (MOBIC) 15 MG tablet Take 15 mg by mouth daily. Patient not taking: Reported on 08/11/2021 06/24/21   [provider]  pseudoephedrine (SUDAFED) 30 MG tablet Take 30 mg by mouth every 4 (four) hours as needed for congestion.      [provider]  TRESIBA FLEXTOUCH 100 UNIT/ML FlexTouch Pen 20 Units daily. With breakfast meal per pt 01/26/20   [provider]    Allergies    Patient has no known allergies.  Review of Systems   Review of Systems  Gastrointestinal:  Positive for abdominal pain and vomiting.  All other systems reviewed and are negative.  Physical Exam Updated Vital Signs Temp 98.4 F (36.9 C) (  Oral)   Resp (!) 28   Ht 5\' 6"  (1.676 m)   Wt 91.2 kg   LMP 12/04/2014 (Exact Date)   SpO2 100%   BMI 32.44 kg/m   Physical Exam Vitals and nursing note reviewed.  Constitutional:      General: She is not in acute distress.    Appearance: She is well-developed.     Comments: Appears uncomfortable but nontoxic.  HENT:     Head: Atraumatic.  Eyes:     Conjunctiva/sclera: Conjunctivae normal.  Cardiovascular:     Rate and Rhythm: Normal rate and regular rhythm.  Pulmonary:     Effort: Pulmonary effort is normal.  Abdominal:     Palpations: Abdomen is soft.     Tenderness: There is abdominal tenderness (Mild generalized abdominal tenderness without focal point tenderness no guarding or rebound tenderness).  Musculoskeletal:     Cervical back: Neck supple.     Comments: Right hip with normal range of motion, surgical dressing is in place and no tenderness to palpation overlying the surgical site no surrounding erythema  Skin:    Findings: No rash.  Neurological:     Mental Status: She is alert. Mental status is at baseline.  Psychiatric:        Mood and Affect: Mood normal.    ED Results / Procedures / Treatments   Labs (all labs ordered are listed, but only abnormal results are displayed) Labs Reviewed  LIPASE, BLOOD - Abnormal; Notable for the following components:      Result Value   Lipase 5 (*)    All other components within normal limits  COMPREHENSIVE METABOLIC PANEL - Abnormal; Notable for the following components:   CO2 9 (*)    Glucose, Bld 197 (*)     Calcium 10.4 (*)    Anion gap 30 (*)    All other components within normal limits  CBC - Abnormal; Notable for the following components:   WBC 13.2 (*)    Platelets 497 (*)    All other components within normal limits  URINALYSIS, ROUTINE W REFLEX MICROSCOPIC - Abnormal; Notable for the following components:   Color, Urine COLORLESS (*)    Glucose, UA >1,000 (*)    Hgb urine dipstick TRACE (*)    Ketones, ur >80 (*)    Protein, ur 30 (*)    Bacteria, UA RARE (*)    All other components within normal limits  LACTIC ACID, PLASMA - Abnormal; Notable for the following components:   Lactic Acid, Venous 2.5 (*)    All other components within normal limits  RESP PANEL BY RT-PCR (FLU A&B, COVID) ARPGX2  CULTURE, BLOOD (SINGLE)  LACTIC ACID, PLASMA    EKG None  Radiology CT Angio Chest PE W and/or Wo Contrast  Result Date: 08/23/2021 CLINICAL DATA:  Constipation, epigastric pain since yesterday, vomiting today, nausea, diabetes mellitus, hyperlipidemia, former smoker EXAM: CT ANGIOGRAPHY CHEST WITH CONTRAST TECHNIQUE: Multidetector CT imaging of the chest was performed using the standard protocol during bolus administration of intravenous contrast. Multiplanar CT image reconstructions and MIPs were obtained to evaluate the vascular anatomy. CONTRAST:  151mL OMNIPAQUE IOHEXOL 350 MG/ML SOLN IV COMPARISON:  None FINDINGS: Cardiovascular: Aorta normal caliber with atherosclerotic calcifications. No aortic aneurysm or dissection. Heart unremarkable. No pericardial effusion. Pulmonary arteries adequately opacified and patent. No evidence of pulmonary embolism. Mediastinum/Nodes: Base of cervical region normal appearance. Esophagus unremarkable. Lap band near GE junction. No thoracic adenopathy Lungs/Pleura: Subsegmental atelectasis RIGHT middle lobe medially.  Lungs otherwise clear. No pulmonary infiltrate, pleural effusion, or pneumothorax. Questionable 2 mm subpleural nodule LEFT lower lobe image 84.  Additional questionable subpleural nodule RIGHT upper lobe 3 mm image 41. Upper Abdomen: Distended stomach below laparoscopic gastric band. Low-attenuation foci within liver question small cysts Musculoskeletal: No acute osseous findings. Review of the MIP images confirms the above findings. IMPRESSION: No evidence of pulmonary embolism. Subsegmental atelectasis RIGHT middle lobe medially. Questionable tiny subpleural nodules in both lungs; no follow-up needed if patient is low-risk (and has no known or suspected primary neoplasm). Non-contrast chest CT can be considered in 12 months if patient is high-risk. This recommendation follows the consensus statement: Guidelines for Management of Incidental Pulmonary Nodules Detected on CT Images: From the Fleischner Society 2017; Radiology 2017; 284:228-243. Electronically Signed   By: Lavonia Dana M.D.   On: 08/23/2021 16:27   CT ABDOMEN PELVIS W CONTRAST  Result Date: 08/23/2021 CLINICAL DATA:  Epigastric pain, nausea, vomiting, prior laparoscopic gastric banding. Recent hip replacement surgery 08/14/2021 EXAM: CT ABDOMEN AND PELVIS WITH CONTRAST TECHNIQUE: Multidetector CT imaging of the abdomen and pelvis was performed using the standard protocol following bolus administration of intravenous contrast. Sagittal and coronal MPR images reconstructed from axial data set. CONTRAST:  146mL OMNIPAQUE IOHEXOL 350 MG/ML SOLN IV. Dilute oral contrast. COMPARISON:  None FINDINGS: Lower chest: Questionable tiny subpleural nodule LEFT lower lobe see CT chest report. Remaining lung bases clear. Hepatobiliary: Small lesions within liver, some representing cysts, additional of which are indeterminate. Unremarkable gallbladder. No biliary dilatation. Pancreas: Atrophic pancreas without mass Spleen: Normal appearance.  Small splenule inferior to spleen. Adrenals/Urinary Tract: Adrenal glands, kidneys, ureters, and bladder normal appearance Stomach/Bowel: Normal appendix. Prior  laparoscopic gastric banding. Question partial small bowel malrotation with majority of small bowel loops in RIGHT upper quadrant. Stomach distal to gastric band is distended by contrast, though contrast has passed into small bowel loops. Decompressed duodenal bulb and descending duodenum. Component of pyloric stenosis or gastric outlet obstruction not completely excluded. Vascular/Lymphatic: Scattered pelvic phleboliths. Atherosclerotic calcifications aorta and iliac arteries without aneurysm. Vascular structures patent. No adenopathy. Reproductive: Multiple uterine nodules likely representing leiomyomata. LEFT lateral leiomyoma posteriorly 2.7 x 3.1 x 3.6 cm. RIGHT superior fundal leiomyoma 2.6 x 2.3 x 2.6 cm. Small nodules less than 1 cm. Additional mass superior to the uterine fundus, favor exophytic leiomyoma 4.3 x 3.7 x 4.0 cm. Atrophic ovaries. Other: No free air or free fluid.  No hernia. Musculoskeletal: RIGHT hip prosthesis. Overlying soft tissue swelling and tissue infiltration consistent with recent surgery. Multiple foci of soft tissue gas and a small amount of fluid are identified both within and superficial to the muscles, may represent resolving postsurgical changes but infection not excluded. No other osseous abnormalities. IMPRESSION: Prior laparoscopic gastric banding with stomach distal to gastric band distended by contrast, though contrast has passed into small bowel loops; component of pyloric stenosis or gastric outlet obstruction not completely excluded. Question partial small bowel malrotation with majority of small bowel loops in RIGHT upper quadrant. Multiple uterine leiomyomata. Multiple foci of soft tissue gas and a small amount of fluid are identified both within subcutaneous soft tissues and within muscles anterior to the RIGHT hip prosthesis may represent resolving postsurgical changes but infection not excluded by CT. Aortic Atherosclerosis (ICD10-I70.0). Electronically Signed   By:  Lavonia Dana M.D.   On: 08/23/2021 16:42    Procedures Procedures   Medications Ordered in ED Medications  sodium chloride 0.9 % bolus 1,000 mL (has  no administration in time range)  ondansetron (ZOFRAN) injection 4 mg (4 mg Intravenous Given 08/23/21 1139)  morphine 4 MG/ML injection 4 mg (4 mg Intravenous Given 08/23/21 1301)  morphine 4 MG/ML injection 6 mg (6 mg Intravenous Given 08/23/21 1416)  sodium chloride 0.9 % bolus 1,000 mL (0 mLs Intravenous Stopped 08/23/21 1918)  iohexol (OMNIPAQUE) 350 MG/ML injection 100 mL (100 mLs Intravenous Contrast Given 08/23/21 1543)  sodium chloride 0.9 % bolus 1,000 mL (0 mLs Intravenous Stopped 08/23/21 1918)    ED Course  I have reviewed the triage vital signs and the nursing notes.  Pertinent labs & imaging results that were available during my care of the patient were reviewed by me and considered in my medical decision making (see chart for details).    MDM Rules/Calculators/A&P                           BP (!) 163/64   Pulse (!) 124   Temp 98.4 F (36.9 C) (Oral)   Resp 20   Ht 5\' 6"  (1.676 m)   Wt 91.2 kg   LMP 12/04/2014 (Exact Date)   SpO2 100%   BMI 32.44 kg/m   Final Clinical Impression(s) / ED Diagnoses Final diagnoses:  Tachycardia  Dehydration    Rx / DC Orders ED Discharge Orders     None      12:36 PM Patient initially complaining of constipation after having right hip surgery and was taking opiate pain medication.  She did have bowel movement but now having abdominal pain.  She does not have any focal point tenderness on her abdominal exam.  Work-up initiated, CT scan ordered  Patient appears to be in pain during this hospital visit.  She received several dose of pain medication which did help.  She is however, tachycardic with heart rate in the 120s, she is tachypneic but reported it is due to pain.  She denies any significant shortness of breath at this time however due to recent surgery and the risks of  potential PE, will obtain chest CT angio to rule out PE as well as abdominal pelvis CT.  Care discussed with Dr. Matilde Sprang.   5:20 PM Patient is remained tachycardic with heart rates in the 130s with EKG shows sinus tachycardic despite receiving IV fluid.  She also received additional pain medication which did help.  She is not nauseous or vomiting at this time.  CT scan of the chest without any evidence of PE.  Abdomen pelvis CT scan shows signs consistent with postsurgical finding but no definitive concerning finding.  I did consult with orthopedist Dr. Lyla Glassing who felt her symptom is unrelated to her recent surgery.  Plan to have the patient admitted for further evaluation.  Care discussed with Dr. Gilford Raid.    7:53 PM Due to persistent tachycardia despite receive IV fluid, I have consulted [facility] hospitalist, and spoke with Dr. Jillene Bucks who agrees to admit patient for observation but request general surgery to be involved in patient care as well.  8:00 PM Pt will be transfer to [facility] for admission.  I have consulted general surgeon Dr. Kae Heller who agrees to be involve in her care.     Domenic Moras, PA-C 08/23/21 2001    Teressa Lower, MD 08/24/21 [phone]

## 2021-08-23 NOTE — ED Triage Notes (Signed)
Sept 30 rt hip replacement, Constipation, took meds and has had several BM's, Vomiting today, Epigastric pain since yesterday which she thinks it is related to her nausea.

## 2021-08-23 NOTE — ED Notes (Signed)
Pt given ice, crackers, peanut butter crackers, ginger ale, and sprite. Per PA patient is able to eat/drink now.

## 2021-08-23 NOTE — ED Notes (Signed)
Pt given oral contrast to attempt to drink; pt informed that is unable to drink contrast, call nurse to let radiology know, and we will scan pt;   Approx scan time: 1530

## 2021-08-24 ENCOUNTER — Encounter (HOSPITAL_COMMUNITY): Payer: Self-pay | Admitting: Family Medicine

## 2021-08-24 ENCOUNTER — Inpatient Hospital Stay (HOSPITAL_COMMUNITY): Payer: BC Managed Care – PPO

## 2021-08-24 DIAGNOSIS — K567 Ileus, unspecified: Secondary | ICD-10-CM | POA: Diagnosis present

## 2021-08-24 DIAGNOSIS — Z7985 Long-term (current) use of injectable non-insulin antidiabetic drugs: Secondary | ICD-10-CM | POA: Diagnosis not present

## 2021-08-24 DIAGNOSIS — Z96641 Presence of right artificial hip joint: Secondary | ICD-10-CM | POA: Diagnosis present

## 2021-08-24 DIAGNOSIS — R109 Unspecified abdominal pain: Secondary | ICD-10-CM | POA: Diagnosis not present

## 2021-08-24 DIAGNOSIS — D649 Anemia, unspecified: Secondary | ICD-10-CM | POA: Diagnosis present

## 2021-08-24 DIAGNOSIS — Z7982 Long term (current) use of aspirin: Secondary | ICD-10-CM | POA: Diagnosis not present

## 2021-08-24 DIAGNOSIS — R1110 Vomiting, unspecified: Secondary | ICD-10-CM

## 2021-08-24 DIAGNOSIS — R911 Solitary pulmonary nodule: Secondary | ICD-10-CM | POA: Diagnosis present

## 2021-08-24 DIAGNOSIS — R000 Tachycardia, unspecified: Secondary | ICD-10-CM

## 2021-08-24 DIAGNOSIS — E1110 Type 2 diabetes mellitus with ketoacidosis without coma: Secondary | ICD-10-CM | POA: Diagnosis present

## 2021-08-24 DIAGNOSIS — Z794 Long term (current) use of insulin: Secondary | ICD-10-CM

## 2021-08-24 DIAGNOSIS — E860 Dehydration: Secondary | ICD-10-CM | POA: Diagnosis present

## 2021-08-24 DIAGNOSIS — Z79899 Other long term (current) drug therapy: Secondary | ICD-10-CM | POA: Diagnosis not present

## 2021-08-24 DIAGNOSIS — E876 Hypokalemia: Secondary | ICD-10-CM | POA: Diagnosis present

## 2021-08-24 DIAGNOSIS — M79604 Pain in right leg: Secondary | ICD-10-CM | POA: Diagnosis not present

## 2021-08-24 DIAGNOSIS — Z9884 Bariatric surgery status: Secondary | ICD-10-CM | POA: Diagnosis not present

## 2021-08-24 DIAGNOSIS — Z6832 Body mass index (BMI) 32.0-32.9, adult: Secondary | ICD-10-CM | POA: Diagnosis not present

## 2021-08-24 DIAGNOSIS — M1990 Unspecified osteoarthritis, unspecified site: Secondary | ICD-10-CM | POA: Diagnosis present

## 2021-08-24 DIAGNOSIS — Z7984 Long term (current) use of oral hypoglycemic drugs: Secondary | ICD-10-CM | POA: Diagnosis not present

## 2021-08-24 DIAGNOSIS — Z8249 Family history of ischemic heart disease and other diseases of the circulatory system: Secondary | ICD-10-CM | POA: Diagnosis not present

## 2021-08-24 DIAGNOSIS — G4733 Obstructive sleep apnea (adult) (pediatric): Secondary | ICD-10-CM

## 2021-08-24 DIAGNOSIS — R918 Other nonspecific abnormal finding of lung field: Secondary | ICD-10-CM | POA: Diagnosis present

## 2021-08-24 DIAGNOSIS — K3189 Other diseases of stomach and duodenum: Secondary | ICD-10-CM | POA: Diagnosis not present

## 2021-08-24 DIAGNOSIS — E119 Type 2 diabetes mellitus without complications: Secondary | ICD-10-CM

## 2021-08-24 DIAGNOSIS — E870 Hyperosmolality and hypernatremia: Secondary | ICD-10-CM | POA: Diagnosis present

## 2021-08-24 DIAGNOSIS — D75839 Thrombocytosis, unspecified: Secondary | ICD-10-CM | POA: Diagnosis present

## 2021-08-24 DIAGNOSIS — E785 Hyperlipidemia, unspecified: Secondary | ICD-10-CM | POA: Diagnosis present

## 2021-08-24 DIAGNOSIS — Z87891 Personal history of nicotine dependence: Secondary | ICD-10-CM | POA: Diagnosis not present

## 2021-08-24 DIAGNOSIS — E669 Obesity, unspecified: Secondary | ICD-10-CM | POA: Diagnosis present

## 2021-08-24 DIAGNOSIS — M7989 Other specified soft tissue disorders: Secondary | ICD-10-CM | POA: Diagnosis not present

## 2021-08-24 DIAGNOSIS — K219 Gastro-esophageal reflux disease without esophagitis: Secondary | ICD-10-CM | POA: Diagnosis present

## 2021-08-24 DIAGNOSIS — Z20822 Contact with and (suspected) exposure to covid-19: Secondary | ICD-10-CM | POA: Diagnosis present

## 2021-08-24 LAB — BASIC METABOLIC PANEL
Anion gap: 15 (ref 5–15)
Anion gap: 15 (ref 5–15)
Anion gap: 14 (ref 5–15)
Anion gap: 13 (ref 5–15)
BUN: 11 mg/dL (ref 8–23)
BUN: 11 mg/dL (ref 8–23)
BUN: 11 mg/dL (ref 8–23)
BUN: 10 mg/dL (ref 8–23)
CO2: 8 mmol/L — ABNORMAL LOW (ref 22–32)
CO2: 8 mmol/L — ABNORMAL LOW (ref 22–32)
CO2: 10 mmol/L — ABNORMAL LOW (ref 22–32)
CO2: 11 mmol/L — ABNORMAL LOW (ref 22–32)
Calcium: 8.6 mg/dL — ABNORMAL LOW (ref 8.9–10.3)
Calcium: 8.8 mg/dL — ABNORMAL LOW (ref 8.9–10.3)
Calcium: 8.9 mg/dL (ref 8.9–10.3)
Calcium: 9.1 mg/dL (ref 8.9–10.3)
Chloride: 114 mmol/L — ABNORMAL HIGH (ref 98–111)
Chloride: 116 mmol/L — ABNORMAL HIGH (ref 98–111)
Chloride: 116 mmol/L — ABNORMAL HIGH (ref 98–111)
Chloride: 117 mmol/L — ABNORMAL HIGH (ref 98–111)
Creatinine, Ser: 0.82 mg/dL (ref 0.44–1.00)
Creatinine, Ser: 0.77 mg/dL (ref 0.44–1.00)
Creatinine, Ser: 0.83 mg/dL (ref 0.44–1.00)
Creatinine, Ser: 0.75 mg/dL (ref 0.44–1.00)
GFR, Estimated: >60 mL/min (ref >60)
GFR, Estimated: >60 mL/min (ref >60)
GFR, Estimated: >60 mL/min (ref >60)
GFR, Estimated: >60 mL/min (ref >60)
Glucose, Bld: 174 mg/dL — ABNORMAL HIGH (ref 70–99)
Glucose, Bld: 150 mg/dL — ABNORMAL HIGH (ref 70–99)
Glucose, Bld: 149 mg/dL — ABNORMAL HIGH (ref 70–99)
Glucose, Bld: 144 mg/dL — ABNORMAL HIGH (ref 70–99)
Potassium: 3.5 mmol/L (ref 3.5–5.1)
Potassium: 3.3 mmol/L — ABNORMAL LOW (ref 3.5–5.1)
Potassium: 3 mmol/L — ABNORMAL LOW (ref 3.5–5.1)
Potassium: 2.8 mmol/L — ABNORMAL LOW (ref 3.5–5.1)
Sodium: 137 mmol/L (ref 135–145)
Sodium: 139 mmol/L (ref 135–145)
Sodium: 140 mmol/L (ref 135–145)
Sodium: 141 mmol/L (ref 135–145)

## 2021-08-24 LAB — COMPREHENSIVE METABOLIC PANEL
ALT: 18 U/L (ref 0–44)
AST: 20 U/L (ref 15–41)
Albumin: 4.7 g/dL (ref 3.5–5.0)
Alkaline Phosphatase: 58 U/L (ref 38–126)
Anion gap: 30 — ABNORMAL HIGH (ref 5–15)
BUN: 13 mg/dL (ref 8–23)
CO2: 9 mmol/L — ABNORMAL LOW (ref 22–32)
Calcium: 10.4 mg/dL — ABNORMAL HIGH (ref 8.9–10.3)
Chloride: 104 mmol/L (ref 98–111)
Creatinine, Ser: 0.94 mg/dL (ref 0.44–1.00)
GFR, Estimated: >60 mL/min (ref >60)
Glucose, Bld: 197 mg/dL — ABNORMAL HIGH (ref 70–99)
Potassium: 3.5 mmol/L (ref 3.5–5.1)
Sodium: 143 mmol/L (ref 135–145)
Total Bilirubin: 0.6 mg/dL (ref 0.3–1.2)
Total Protein: 7.8 g/dL (ref 6.5–8.1)

## 2021-08-24 LAB — BLOOD GAS, ARTERIAL
Acid-base deficit: 21.5 mmol/L — ABNORMAL HIGH (ref 0.0–2.0)
Bicarbonate: 6.2 mmol/L — ABNORMAL LOW (ref 20.0–28.0)
O2 Saturation: 96.9 %
Patient temperature: 98.6
pCO2 arterial: 18.7 mmHg — CL (ref 32.0–48.0)
pH, Arterial: 7.145 — CL (ref 7.350–7.450)
pO2, Arterial: 140 mmHg — ABNORMAL HIGH (ref 83.0–108.0)

## 2021-08-24 LAB — CBC
HCT: 34.5 % — ABNORMAL LOW (ref 36.0–46.0)
Hemoglobin: 10.4 g/dL — ABNORMAL LOW (ref 12.0–15.0)
MCH: 26.8 pg (ref 26.0–34.0)
MCHC: 30.1 g/dL (ref 30.0–36.0)
MCV: 88.9 fL (ref 80.0–100.0)
Platelets: 382 10*3/uL (ref 150–400)
RBC: 3.88 MIL/uL (ref 3.87–5.11)
RDW: 14.9 % (ref 11.5–15.5)
WBC: 14.6 10*3/uL — ABNORMAL HIGH (ref 4.0–10.5)
nRBC: 0 % (ref 0.0–0.2)

## 2021-08-24 LAB — GLUCOSE, CAPILLARY
Glucose-Capillary: 125 mg/dL — ABNORMAL HIGH (ref 70–99)
Glucose-Capillary: 126 mg/dL — ABNORMAL HIGH (ref 70–99)
Glucose-Capillary: 127 mg/dL — ABNORMAL HIGH (ref 70–99)
Glucose-Capillary: 127 mg/dL — ABNORMAL HIGH (ref 70–99)
Glucose-Capillary: 133 mg/dL — ABNORMAL HIGH (ref 70–99)
Glucose-Capillary: 134 mg/dL — ABNORMAL HIGH (ref 70–99)
Glucose-Capillary: 143 mg/dL — ABNORMAL HIGH (ref 70–99)
Glucose-Capillary: 144 mg/dL — ABNORMAL HIGH (ref 70–99)
Glucose-Capillary: 144 mg/dL — ABNORMAL HIGH (ref 70–99)
Glucose-Capillary: 148 mg/dL — ABNORMAL HIGH (ref 70–99)
Glucose-Capillary: 148 mg/dL — ABNORMAL HIGH (ref 70–99)
Glucose-Capillary: 153 mg/dL — ABNORMAL HIGH (ref 70–99)
Glucose-Capillary: 160 mg/dL — ABNORMAL HIGH (ref 70–99)

## 2021-08-24 LAB — MRSA NEXT GEN BY PCR, NASAL: MRSA by PCR Next Gen: NOT DETECTED

## 2021-08-24 LAB — HEMOGLOBIN A1C
Hgb A1c MFr Bld: 6.2 % — ABNORMAL HIGH (ref 4.8–5.6)
Mean Plasma Glucose: 131.24 mg/dL

## 2021-08-24 LAB — BETA-HYDROXYBUTYRIC ACID
Beta-Hydroxybutyric Acid: >8 mmol/L — ABNORMAL HIGH (ref 0.05–0.27)
Beta-Hydroxybutyric Acid: 5.62 mmol/L — ABNORMAL HIGH (ref 0.05–0.27)

## 2021-08-24 LAB — HIV ANTIBODY (ROUTINE TESTING W REFLEX): HIV Screen 4th Generation wRfx: NONREACTIVE

## 2021-08-24 LAB — MAGNESIUM: Magnesium: 2.2 mg/dL (ref 1.7–2.4)

## 2021-08-24 LAB — TSH: TSH: 0.366 u[IU]/mL (ref 0.350–4.500)

## 2021-08-24 LAB — CBG MONITORING, ED: Glucose-Capillary: 159 mg/dL — ABNORMAL HIGH (ref 70–99)

## 2021-08-24 LAB — LIPASE, BLOOD: Lipase: <10 U/L — ABNORMAL LOW (ref 11–51)

## 2021-08-24 MED ORDER — CHLORHEXIDINE GLUCONATE CLOTH 2 % EX PADS
6.0000 | MEDICATED_PAD | Freq: Every day | CUTANEOUS | Status: DC
Start: 2021-08-24 — End: 2021-08-26
  Administered 2021-08-24 – 2021-08-25 (×2): 6 via TOPICAL

## 2021-08-24 MED ORDER — SODIUM CHLORIDE 0.9 % IV SOLN
INTRAVENOUS | Status: DC | PRN
  Administered 2021-08-24 – 2021-08-25 (×2): 500 mL via INTRAVENOUS
  Administered 2021-08-26: 250 mL via INTRAVENOUS

## 2021-08-24 MED ORDER — MORPHINE SULFATE (PF) 2 MG/ML IV SOLN
2.0000 mg | INTRAVENOUS | Status: DC | PRN
  Administered 2021-08-24 (×2): 2 mg via INTRAVENOUS
  Administered 2021-08-25: 4 mg via INTRAVENOUS
  Administered 2021-08-25 (×2): 2 mg via INTRAVENOUS
  Administered 2021-08-25: 4 mg via INTRAVENOUS
  Administered 2021-08-25 – 2021-08-26 (×2): 2 mg via INTRAVENOUS
  Administered 2021-08-26 – 2021-08-27 (×2): 4 mg via INTRAVENOUS
  Administered 2021-08-27: 2 mg via INTRAVENOUS
  Filled 2021-08-24 (×4): qty 1
  Filled 2021-08-24: qty 2
  Filled 2021-08-24 (×2): qty 1
  Filled 2021-08-24 (×2): qty 2
  Filled 2021-08-24: qty 1
  Filled 2021-08-24: qty 2

## 2021-08-24 MED ORDER — INSULIN ASPART 100 UNIT/ML IJ SOLN: 0.0000 [IU] | Freq: Every day | INTRAMUSCULAR | Status: DC

## 2021-08-24 MED ORDER — ALUM & MAG HYDROXIDE-SIMETH 200-200-20 MG/5ML PO SUSP
30.0000 mL | ORAL | Status: DC | PRN
  Administered 2021-08-24 – 2021-08-28 (×11): 30 mL via ORAL
  Filled 2021-08-24 (×11): qty 30

## 2021-08-24 MED ORDER — ACETAMINOPHEN 325 MG PO TABS
650.0000 mg | ORAL_TABLET | Freq: Four times a day (QID) | ORAL | Status: DC | PRN
  Administered 2021-08-25 – 2021-08-28 (×2): 650 mg via ORAL
  Filled 2021-08-24 (×2): qty 2

## 2021-08-24 MED ORDER — ACETAMINOPHEN 650 MG RE SUPP: 650.0000 mg | Freq: Four times a day (QID) | RECTAL | Status: DC | PRN

## 2021-08-24 MED ORDER — SODIUM CHLORIDE 0.9 % IV SOLN: INTRAVENOUS | Status: DC

## 2021-08-24 MED ORDER — LIP MEDEX EX OINT
TOPICAL_OINTMENT | CUTANEOUS | Status: DC | PRN
  Filled 2021-08-24: qty 7

## 2021-08-24 MED ORDER — ONDANSETRON HCL 4 MG/2ML IJ SOLN
4.0000 mg | Freq: Four times a day (QID) | INTRAMUSCULAR | Status: DC | PRN
  Administered 2021-08-24 – 2021-08-27 (×8): 4 mg via INTRAVENOUS
  Filled 2021-08-24 (×8): qty 2

## 2021-08-24 MED ORDER — INSULIN ASPART 100 UNIT/ML IJ SOLN: 0.0000 [IU] | Freq: Three times a day (TID) | INTRAMUSCULAR | Status: DC

## 2021-08-24 MED ORDER — ONDANSETRON HCL 4 MG PO TABS: 4.0000 mg | ORAL_TABLET | Freq: Four times a day (QID) | ORAL | Status: DC | PRN

## 2021-08-24 MED ORDER — DEXTROSE 50 % IV SOLN: 0.0000 mL | INTRAVENOUS | Status: DC | PRN

## 2021-08-24 MED ORDER — POTASSIUM CHLORIDE 10 MEQ/100ML IV SOLN
10.0000 meq | INTRAVENOUS | Status: AC
  Administered 2021-08-24 (×2): 10 meq via INTRAVENOUS
  Filled 2021-08-24 (×2): qty 100

## 2021-08-24 MED ORDER — INSULIN REGULAR(HUMAN) IN NACL 100-0.9 UT/100ML-% IV SOLN
INTRAVENOUS | Status: DC
  Administered 2021-08-24: 2.4 [IU]/h via INTRAVENOUS
  Filled 2021-08-24 (×2): qty 100

## 2021-08-24 MED ORDER — LACTATED RINGERS IV SOLN: INTRAVENOUS | Status: DC

## 2021-08-24 MED ORDER — SODIUM CHLORIDE 0.45 % IV SOLN
INTRAVENOUS | Status: DC
  Filled 2021-08-24: qty 75

## 2021-08-24 MED ORDER — DEXTROSE IN LACTATED RINGERS 5 % IV SOLN: INTRAVENOUS | Status: DC

## 2021-08-24 NOTE — Progress Notes (Signed)
CRITICAL VALUE STICKER  CRITICAL VALUE: ABG pH 7.145 and co2 18.7  RECEIVER (on-site recipient of call): Garlon Hatchet, RN  DATE & TIME NOTIFIED: 08/24/2021 at 1043  MESSENGER (representative from lab): Jolande  MD NOTIFIED: Dr. Rowe Pavy via Hydro page  TIME OF NOTIFICATION: 08/24/2021 at 1044  RESPONSE: Pending.

## 2021-08-24 NOTE — Progress Notes (Signed)
Pt arrived from [facility] via [facility], A&Ox4, allowed to walk to the bathroom. Placed on telemetry #61, Hospitalist  notified of arrival to this room. Pt is hungry but advised of NPO status , mouth very dry so ice chips provided. Rt ant hip with silicone drsg intact since surgery per patient and due to be changed Oct 17th. Pt c/o lower post back discomfort.

## 2021-08-24 NOTE — Consult Note (Signed)
Reason for Consult: Gastric obstruction gastric obstruction Referring Physician: Dr. Aliene Beams Felicia Hayden is an 60 y.o. female.  HPI: Patient is a 60 year old female who was admitted secondary to epigastric abdominal pain, nausea vomiting.  Patient has a history of diabetes, OSA, laparoscopic gastric banding in 2005, recent right total hip replacement on 08/14/2021.  Patient states that she had epigastric abdominal pain with nausea vomiting over the last several days prior to admission.  She states that the pain was in the epigastrium.  She states that after her nausea vomiting episode she felt better.  Patient underwent CT scan at outside hospital.  Patient was seen to have dilated stomach as well as duodenum.  There appeared to be no masses.  General surgery was consulted and patient was transferred to [facility].    Past Medical History:  Diagnosis Date   Abnormal Pap smear    Arthritis    Diabetes mellitus without complication ([place])    Heart murmur    History of abnormal Pap smear 1978   S/P cryotherapy   Hyperlipidemia    Sleep apnea     Past Surgical History:  Procedure Laterality Date   DILATION AND CURETTAGE OF UTERUS  03/16/1999   HIP SURGERY Right    LAPAROSCOPIC GASTRIC BANDING     las[facility] eye surgery      TONSILLECTOMY  11/15/1981    Family History  Problem Relation Age of Onset   Hypertension Mother    Cancer Mother        ovarian; uterine   Ovarian cancer Mother    Thyroid disease Mother    Hypothyroidism Mother    Cancer Sister        skin   Cancer Sister        lung   Pneumonia Father    Heart attack Maternal Grandmother    Diabetes Neg Hx    Allergic rhinitis Neg Hx    Angioedema Neg Hx    Asthma Neg Hx    Atopy Neg Hx    Eczema Neg Hx    Immunodeficiency Neg Hx    Urticaria Neg Hx     Social History:  reports that she has quit smoking. She has never used smokeless tobacco. She reports that she does not drink alcohol and  does not use drugs.  Allergies: No Known Allergies  Medications: I have reviewed the patient's current medications.  Results for orders placed or performed during the hospital encounter of 08/23/21 (from the past 48 hour(s))  Lipase, blood     Status: Abnormal   Collection Time: 08/23/21 11:32 AM  Result Value Ref Range   Lipase <10 (L) 11 - 51 U/L    Comment: Performed at [facility], [place], [place] [address] CORRECTED ON 10/10 AT 1009: PREVIOUSLY REPORTED AS 5 <10   Comprehensive metabolic panel     Status: Abnormal   Collection Time: 08/23/21 11:32 AM  Result Value Ref Range   Sodium 143 135 - 145 mmol/L   Potassium 3.5 3.5 - 5.1 mmol/L   Chloride 104 98 - 111 mmol/L   CO2 9 (L) 22 - 32 mmol/L   Glucose, Bld 197 (H) 70 - 99 mg/dL    Comment: Glucose reference range applies only to samples taken after fasting for at least 8 hours.   BUN 13 8 - 23 mg/dL   Creatinine, Ser 0.94 0.44 - 1.00 mg/dL   Calcium 10.4 (H) 8.9 - 10.3 mg/dL  Total Protein 7.8 6.5 - 8.1 g/dL   Albumin 4.7 3.5 - 5.0 g/dL   AST 20 15 - 41 U/L   ALT 18 0 - 44 U/L   Alkaline Phosphatase 58 38 - 126 U/L   Total Bilirubin 0.6 0.3 - 1.2 mg/dL   GFR, Estimated >60 >60 mL/min    Comment: (NOTE) Calculated using the CKD-EPI Creatinine Equation (2021)    Anion gap >20 (H) 5 - 15    Comment: Performed at [facility], [place], [place] [address] CORRECTED ON 10/10 AT 1019: PREVIOUSLY REPORTED AS 30, CORRECTED ON 10/10 AT 1009: PREVIOUSLY REPORTED AS 30 REPEATED TO VERIFY Electrolytes repeated to confirm   CBC     Status: Abnormal   Collection Time: 08/23/21 11:32 AM  Result Value Ref Range   WBC 13.2 (H) 4.0 - 10.5 K/uL   RBC 4.70 3.87 - 5.11 MIL/uL   Hemoglobin 12.5 12.0 - 15.0 g/dL   HCT 40.9 36.0 - 46.0 %   MCV 87.0 80.0 - 100.0 fL   MCH 26.6 26.0 - 34.0 pg   MCHC 30.6 30.0 - 36.0 g/dL   RDW 14.3 11.5 - 15.5 %   Platelets 497 (H)  150 - 400 K/uL   nRBC 0.0 0.0 - 0.2 %    Comment: Performed at [facility], [address] [place], [place] [address]  Urinalysis, Routine w reflex microscopic Urine, Clean Catch     Status: Abnormal   Collection Time: 08/23/21  1:04 PM  Result Value Ref Range   Color, Urine COLORLESS (A) YELLOW   APPearance CLEAR CLEAR   Specific Gravity, Urine 1.017 1.005 - 1.030   pH 5.0 5.0 - 8.0   Glucose, UA >1,000 (A) NEGATIVE mg/dL   Hgb urine dipstick TRACE (A) NEGATIVE   Bilirubin Urine NEGATIVE NEGATIVE   Ketones, ur >80 (A) NEGATIVE mg/dL   Protein, ur 30 (A) NEGATIVE mg/dL   Nitrite NEGATIVE NEGATIVE   Leukocytes,Ua NEGATIVE NEGATIVE   RBC / HPF 0-5 0 - 5 RBC/hpf   WBC, UA 0-5 0 - 5 WBC/hpf   Bacteria, UA RARE (A) NONE SEEN   Squamous Epithelial / LPF 0-5 0 - 5   Mucus PRESENT    Hyaline Casts, UA PRESENT     Comment: Performed at [facility], [address], [place], [place] [address]  Culture, blood (single) w Reflex to ID Panel     Status: None (Preliminary result)   Collection Time: 08/23/21  3:13 PM   Specimen: BLOOD  Result Value Ref Range   Specimen Description      BLOOD BLOOD LEFT FOREARM Performed at [facility], [address] [place], [place] [address]    Special Requests      BOTTLES DRAWN AEROBIC AND ANAEROBIC Blood Culture adequate volume Performed at [facility], [address] [place], [place] [address]    Culture      NO GROWTH < 24 HOURS Performed at [facility], [address], [place], [place] [address]    Report Status PENDING   Lactic acid, plasma     Status: Abnormal   Collection Time: 08/23/21  3:47 PM  Result Value Ref Range   Lactic Acid, Venous 2.5 (HH) 0.5 - 1.9 mmol/L    Comment: CRITICAL RESULT CALLED TO, READ BACK BY AND VERIFIED WITH: Lissa Merlin RN [phone] 08/23/21 BY BOWMAN K Performed at [facility], [address]  [place], [place]  [address]   Lactic acid, plasma     Status: None   Collection Time: 08/23/21  5:14 PM  Result Value Ref Range   Lactic Acid, Venous 1.1 0.5 - 1.9 mmol/L    Comment: Performed at [facility], [address], [place], [place] [address]  Resp Panel by RT-PCR (Flu A&B, Covid) Nasopharyngeal Swab     Status: None   Collection Time: 08/23/21  5:25 PM   Specimen: Nasopharyngeal Swab; Nasopharyngeal(NP) swabs in vial transport medium  Result Value Ref Range   SARS Coronavirus 2 by RT PCR NEGATIVE NEGATIVE    Comment: (NOTE) SARS-CoV-2 target nucleic acids are NOT DETECTED.  The SARS-CoV-2 RNA is generally detectable in upper respiratory specimens during the acute phase of infection. The lowest concentration of SARS-CoV-2 viral copies this assay can detect is 138 copies/mL. A negative result does not preclude SARS-Cov-2 infection and should not be used as the sole basis for treatment or other patient management decisions. A negative result may occur with  improper specimen collection/handling, submission of specimen other than nasopharyngeal swab, presence of viral mutation(s) within the areas targeted by this assay, and inadequate number of viral copies(<138 copies/mL). A negative result must be combined with clinical observations, patient history, and epidemiological information. The expected result is Negative.  Fact Sheet for Patients:  EntrepreneurPulse.com.au  Fact Sheet for Healthcare Providers:  IncredibleEmployment.be  This test is no t yet approved or cleared by the [place] FDA and  has been authorized for detection and/or diagnosis of SARS-CoV-2 by FDA under an Emergency Use Authorization (EUA). This EUA will remain  in effect (meaning this test can be used) for the duration of the COVID-19 declaration under Section 564(b)(1) of the Act, 21 U.S.C.section 360bbb-3(b)(1), unless the authorization is  terminated  or revoked sooner.       Influenza A by PCR NEGATIVE NEGATIVE   Influenza B by PCR NEGATIVE NEGATIVE    Comment: (NOTE) The Xpert Xpress SARS-CoV-2/FLU/RSV plus assay is intended as an aid in the diagnosis of influenza from Nasopharyngeal swab specimens and should not be used as a sole basis for treatment. Nasal washings and aspirates are unacceptable for Xpert Xpress SARS-CoV-2/FLU/RSV testing.  Fact Sheet for Patients: EntrepreneurPulse.com.au  Fact Sheet for Healthcare Providers: IncredibleEmployment.be  This test is not yet approved or cleared by the [place] FDA and has been authorized for detection and/or diagnosis of SARS-CoV-2 by FDA under an Emergency Use Authorization (EUA). This EUA will remain in effect (meaning this test can be used) for the duration of the COVID-19 declaration under Section 564(b)(1) of the Act, 21 U.S.C. section 360bbb-3(b)(1), unless the authorization is terminated or revoked.  Performed at [facility], [address], [place], [place] [address]   CBG monitoring, ED     Status: Abnormal   Collection Time: 08/23/21 11:34 PM  Result Value Ref Range   Glucose-Capillary 167 (H) 70 - 99 mg/dL    Comment: Glucose reference range applies only to samples taken after fasting for at least 8 hours.  CBG monitoring, ED     Status: Abnormal   Collection Time: 08/24/21  3:35 AM  Result Value Ref Range   Glucose-Capillary 159 (H) 70 - 99 mg/dL    Comment: Glucose reference range applies only to samples taken after fasting for at least 8 hours.  Hemoglobin A1c     Status: Abnormal   Collection Time: 08/24/21  5:21 AM  Result Value Ref Range   Hgb A1c MFr  Bld 6.2 (H) 4.8 - 5.6 %    Comment: (NOTE) Pre diabetes:          5.7%-6.4%  Diabetes:              >6.4%  Glycemic control for   <7.0% adults with diabetes    Mean Plasma Glucose 131.24 mg/dL    Comment: Performed at [facility], [place] [address] [place], [place] [address]  HIV Antibody (routine testing w rflx)     Status: None   Collection Time: 08/24/21  5:21 AM  Result Value Ref Range   HIV Screen 4th Generation wRfx Non Reactive Non Reactive    Comment: Performed at [facility], [place] [address] [place], [place] [address]  CBC     Status: Abnormal   Collection Time: 08/24/21  5:21 AM  Result Value Ref Range   WBC 14.6 (H) 4.0 - 10.5 K/uL   RBC 3.88 3.87 - 5.11 MIL/uL   Hemoglobin 10.4 (L) 12.0 - 15.0 g/dL   HCT 34.5 (L) 36.0 - 46.0 %   MCV 88.9 80.0 - 100.0 fL   MCH 26.8 26.0 - 34.0 pg   MCHC 30.1 30.0 - 36.0 g/dL   RDW 14.9 11.5 - 15.5 %   Platelets 382 150 - 400 K/uL   nRBC 0.0 0.0 - 0.2 %    Comment: Performed at [facility], [place] [address], [place], [place] [address]  Basic metabolic panel     Status: Abnormal   Collection Time: 08/24/21  5:21 AM  Result Value Ref Range   Sodium 137 135 - 145 mmol/L   Potassium 3.5 3.5 - 5.1 mmol/L   Chloride 114 (H) 98 - 111 mmol/L   CO2 8 (L) 22 - 32 mmol/L   Glucose, Bld 174 (H) 70 - 99 mg/dL    Comment: Glucose reference range applies only to samples taken after fasting for at least 8 hours.   BUN 11 8 - 23 mg/dL   Creatinine, Ser 0.82 0.44 - 1.00 mg/dL   Calcium 8.6 (L) 8.9 - 10.3 mg/dL   GFR, Estimated >60 >60 mL/min    Comment: (NOTE) Calculated using the CKD-EPI Creatinine Equation (2021)    Anion gap 15 5 - 15    Comment: Performed at [facility], [place] [address] [place], [place] [address]  Magnesium     Status: None   Collection Time: 08/24/21  5:21 AM  Result Value Ref Range   Magnesium 2.2 1.7 - 2.4 mg/dL    Comment: Performed at [facility], [place] [address] [place], [place] [address]  TSH     Status: None   Collection Time: 08/24/21  5:21 AM  Result Value Ref Range   TSH 0.366 0.350 - 4.500 uIU/mL    Comment: Performed by a 3rd Generation assay with a functional  sensitivity of <=0.01 uIU/mL. Performed at [facility], [place] [address] [place], [place] [address]   Glucose, capillary     Status: Abnormal   Collection Time: 08/24/21  8:47 AM  Result Value Ref Range   Glucose-Capillary 144 (H) 70 - 99 mg/dL    Comment: Glucose reference range applies only to samples taken after fasting for at least 8 hours.  Blood gas, arterial     Status: Abnormal   Collection Time: 08/24/21 10:24 AM  Result Value Ref Range   pH, Arterial 7.145 (LL) 7.350 - 7.450    Comment:  CRITICAL RESULT CALLED TO, READ BACK BY AND VERIFIED WITH: CATESLAND, A. RN ON 10.10.2022 @ 1043 BY MECIAL J.    pCO2 arterial 18.7 (LL) 32.0 - 48.0 mmHg    Comment: CATESLAND, A. RN ON 08/24/2021 @ 1043 BY MECIAL J.   pO2, Arterial 140 (H) 83.0 - 108.0 mmHg   Bicarbonate 6.2 (L) 20.0 - 28.0 mmol/L   Acid-base deficit 21.5 (H) 0.0 - 2.0 mmol/L   O2 Saturation 96.9 %   Patient temperature 98.6    Collection site RIGHT RADIAL    Allens test (pass/fail) PASS PASS    Comment: Performed at [facility], [place] [address] [place], [place] [address]  Glucose, capillary     Status: Abnormal   Collection Time: 08/24/21 11:31 AM  Result Value Ref Range   Glucose-Capillary 143 (H) 70 - 99 mg/dL    Comment: Glucose reference range applies only to samples taken after fasting for at least 8 hours.    CT Angio Chest PE W and/or Wo Contrast  Result Date: 08/23/2021 CLINICAL DATA:  Constipation, epigastric pain since yesterday, vomiting today, nausea, diabetes mellitus, hyperlipidemia, former smoker EXAM: CT ANGIOGRAPHY CHEST WITH CONTRAST TECHNIQUE: Multidetector CT imaging of the chest was performed using the standard protocol during bolus administration of intravenous contrast. Multiplanar CT image reconstructions and MIPs were obtained to evaluate the vascular anatomy. CONTRAST:  141mL OMNIPAQUE IOHEXOL 350 MG/ML SOLN IV COMPARISON:  None FINDINGS: Cardiovascular:  Aorta normal caliber with atherosclerotic calcifications. No aortic aneurysm or dissection. Heart unremarkable. No pericardial effusion. Pulmonary arteries adequately opacified and patent. No evidence of pulmonary embolism. Mediastinum/Nodes: Base of cervical region normal appearance. Esophagus unremarkable. Lap band near GE junction. No thoracic adenopathy Lungs/Pleura: Subsegmental atelectasis RIGHT middle lobe medially. Lungs otherwise clear. No pulmonary infiltrate, pleural effusion, or pneumothorax. Questionable 2 mm subpleural nodule LEFT lower lobe image 84. Additional questionable subpleural nodule RIGHT upper lobe 3 mm image 41. Upper Abdomen: Distended stomach below laparoscopic gastric band. Low-attenuation foci within liver question small cysts Musculoskeletal: No acute osseous findings. Review of the MIP images confirms the above findings. IMPRESSION: No evidence of pulmonary embolism. Subsegmental atelectasis RIGHT middle lobe medially. Questionable tiny subpleural nodules in both lungs; no follow-up needed if patient is low-risk (and has no known or suspected primary neoplasm). Non-contrast chest CT can be considered in 12 months if patient is high-risk. This recommendation follows the consensus statement: Guidelines for Management of Incidental Pulmonary Nodules Detected on CT Images: From the Fleischner Society 2017; Radiology 2017; 284:228-243. Electronically Signed   By: Lavonia Dana M.D.   On: 08/23/2021 16:27   CT ABDOMEN PELVIS W CONTRAST  Result Date: 08/23/2021 CLINICAL DATA:  Epigastric pain, nausea, vomiting, prior laparoscopic gastric banding. Recent hip replacement surgery 08/14/2021 EXAM: CT ABDOMEN AND PELVIS WITH CONTRAST TECHNIQUE: Multidetector CT imaging of the abdomen and pelvis was performed using the standard protocol following bolus administration of intravenous contrast. Sagittal and coronal MPR images reconstructed from axial data set. CONTRAST:  145mL OMNIPAQUE IOHEXOL 350  MG/ML SOLN IV. Dilute oral contrast. COMPARISON:  None FINDINGS: Lower chest: Questionable tiny subpleural nodule LEFT lower lobe see CT chest report. Remaining lung bases clear. Hepatobiliary: Small lesions within liver, some representing cysts, additional of which are indeterminate. Unremarkable gallbladder. No biliary dilatation. Pancreas: Atrophic pancreas without mass Spleen: Normal appearance.  Small splenule inferior to spleen. Adrenals/Urinary Tract: Adrenal glands, kidneys, ureters, and bladder normal appearance Stomach/Bowel: Normal appendix. Prior laparoscopic gastric banding. Question partial small bowel malrotation  with majority of small bowel loops in RIGHT upper quadrant. Stomach distal to gastric band is distended by contrast, though contrast has passed into small bowel loops. Decompressed duodenal bulb and descending duodenum. Component of pyloric stenosis or gastric outlet obstruction not completely excluded. Vascular/Lymphatic: Scattered pelvic phleboliths. Atherosclerotic calcifications aorta and iliac arteries without aneurysm. Vascular structures patent. No adenopathy. Reproductive: Multiple uterine nodules likely representing leiomyomata. LEFT lateral leiomyoma posteriorly 2.7 x 3.1 x 3.6 cm. RIGHT superior fundal leiomyoma 2.6 x 2.3 x 2.6 cm. Small nodules less than 1 cm. Additional mass superior to the uterine fundus, favor exophytic leiomyoma 4.3 x 3.7 x 4.0 cm. Atrophic ovaries. Other: No free air or free fluid.  No hernia. Musculoskeletal: RIGHT hip prosthesis. Overlying soft tissue swelling and tissue infiltration consistent with recent surgery. Multiple foci of soft tissue gas and a small amount of fluid are identified both within and superficial to the muscles, may represent resolving postsurgical changes but infection not excluded. No other osseous abnormalities. IMPRESSION: Prior laparoscopic gastric banding with stomach distal to gastric band distended by contrast, though contrast  has passed into small bowel loops; component of pyloric stenosis or gastric outlet obstruction not completely excluded. Question partial small bowel malrotation with majority of small bowel loops in RIGHT upper quadrant. Multiple uterine leiomyomata. Multiple foci of soft tissue gas and a small amount of fluid are identified both within subcutaneous soft tissues and within muscles anterior to the RIGHT hip prosthesis may represent resolving postsurgical changes but infection not excluded by CT. Aortic Atherosclerosis (ICD10-I70.0). Electronically Signed   By: Lavonia Dana M.D.   On: 08/23/2021 16:42    Review of Systems  Constitutional:  Negative for chills and fever.  HENT:  Negative for ear discharge, hearing loss and sore throat.   Eyes:  Negative for discharge.  Respiratory:  Negative for cough and shortness of breath.   Cardiovascular:  Negative for chest pain and leg swelling.  Gastrointestinal:  Positive for nausea and vomiting. Negative for abdominal pain, constipation and diarrhea.  Musculoskeletal:  Negative for myalgias and neck pain.  Skin:  Negative for rash.  Allergic/Immunologic: Negative for environmental allergies.  Neurological:  Negative for dizziness and seizures.  Hematological:  Does not bruise/bleed easily.  Psychiatric/Behavioral:  Negative for suicidal ideas.   All other systems reviewed and are negative. Blood pressure (!) 155/64, pulse (!) 102, temperature 99.2 F (37.3 C), temperature source Oral, resp. rate 20, height 5\' 6"  (1.676 m), weight 91.2 kg, last menstrual period 12/04/2014, SpO2 99 %. Physical Exam Constitutional:      Appearance: She is well-developed.     Comments: Conversant No acute distress  HENT:     Head: Normocephalic and atraumatic.  Eyes:     General: Lids are normal. No scleral icterus.    Pupils: Pupils are equal, round, and reactive to light.     Comments: Pupils are equal round and reactive No lid lag Moist conjunctiva  Neck:      Thyroid: No thyromegaly.     Trachea: No tracheal tenderness.     Comments: No cervical lymphadenopathy Cardiovascular:     Rate and Rhythm: Normal rate and regular rhythm.     Heart sounds: No murmur heard. Pulmonary:     Effort: Pulmonary effort is normal.     Breath sounds: Normal breath sounds. No wheezing or rales.  Abdominal:     Tenderness: There is no abdominal tenderness. There is no guarding or rebound.     Hernia: No  hernia is present.  Musculoskeletal:     Cervical back: Normal range of motion and neck supple.  Skin:    General: Skin is warm.     Findings: No rash.     Nails: There is no clubbing.     Comments: Normal skin turgor  Neurological:     Mental Status: She is alert and oriented to person, place, and time.     Comments: Normal gait and station  Psychiatric:        Mood and Affect: Mood normal.        Thought Content: Thought content normal.        Judgment: Judgment normal.     Comments: Appropriate affect    Assessment/Plan: 60 year old female history of laparoscopic gastric banding, recent hip replacement likely with gastric ileus. 1.  At this time we will obtain a KUB to evaluate abdominal distention 2.  Patient is otherwise asymptomatic currently.  If KUB is reassuring we may start a liquid diet and see how she does with that. 3.  Would recommend mobilization. 4.  We will follow along.  Ralene Ok 08/24/2021, 12:49 PM

## 2021-08-24 NOTE — H&P (Signed)
History and Physical    Karoline Fleer [facility]:[id number] DOB: 1961-11-07 DOA: 08/23/2021  PCP: Jonathon Jordan, MD   Patient coming from: Home   Chief Complaint: Epigastric pain, N/V   HPI: Felicia Hayden is a 60 y.o. female with medical history significant for insulin-dependent diabetes mellitus, OSA, laparoscopic gastric banding in 2005, and right total hip arthroplasty on 08/14/2021, now presenting to the emergency department for evaluation of epigastric pain, nausea, and vomiting.  Patient reports that hip pain is well controlled and she has been ambulating without difficulty, was having some constipation since the surgery but this resolved with MiraLAX and suppositories, but then she went on to develop some epigastric pain with nausea and nonbloody vomiting on 08/22/2021.  Patient reports that when she eats or drinks, she develops pain in the epigastrium and nausea.  She has never experienced this previously, has never had problems with the gastric band before, and does not believe that she ever had an EGD.  She denies any fevers or chills.  She denies chest pain, cough, or shortness of breath.  [facility] ED Course: Upon arrival to the ED, patient is found to be afebrile, saturating well on room air, tachycardic to 130s, and with stable blood pressure.  EKG features sinus tachycardia with nonspecific repolarization abnormality.  Chemistry panel with glucose 197 and bicarbonate of 9.  CBC with leukocytosis to 13,200 and mild thrombocytosis.  Initial lactic acid was 2.5.  CTA chest is negative for PE and CT of the abdomen and pelvis raises question of pyloric stenosis or gastric outlet obstruction, as well as small bowel malrotation.  Surgery was consulted by the ED physician and the patient was given 3 L of saline, multiple doses IV morphine, and Zofran prior to transfer to [facility] for ongoing management.  Review of Systems:  All other systems reviewed and  apart from HPI, are negative.  Past Medical History:  Diagnosis Date   Abnormal Pap smear    Arthritis    Diabetes mellitus without complication (HCC)    Heart murmur    History of abnormal Pap smear 1978   S/P cryotherapy   Hyperlipidemia    Sleep apnea     Past Surgical History:  Procedure Laterality Date   DILATION AND CURETTAGE OF UTERUS  03/16/1999   HIP SURGERY Right    LAPAROSCOPIC GASTRIC BANDING     [facility] surgery      TONSILLECTOMY  11/15/1981    Social History:   reports that she has quit smoking. She has never used smokeless tobacco. She reports that she does not drink alcohol and does not use drugs.  No Known Allergies  Family History  Problem Relation Age of Onset   Hypertension Mother    Cancer Mother        ovarian; uterine   Ovarian cancer Mother    Thyroid disease Mother    Hypothyroidism Mother    Cancer Sister        skin   Cancer Sister        lung   Pneumonia Father    Heart attack Maternal Grandmother    Diabetes Neg Hx    Allergic rhinitis Neg Hx    Angioedema Neg Hx    Asthma Neg Hx    Atopy Neg Hx    Eczema Neg Hx    Immunodeficiency Neg Hx    Urticaria Neg Hx      Prior to Admission medications   Medication Sig  Start Date End Date Taking? Authorizing Provider  aspirin 325 MG tablet Take 325 mg by mouth daily.   Yes [provider]  JARDIANCE 25 MG TABS tablet Take 25 mg by mouth daily. 01/05/20  Yes [provider]  levocetirizine (XYZAL) 5 MG tablet Take 5 mg by mouth daily. 04/21/21  Yes [provider]  Semaglutide-Weight Management (WEGOVY) 2.4 MG/0.75ML SOAJ Inject 2.4 mg into the skin. PT takes on Monday per pt   Yes [provider]  traMADol (ULTRAM) 50 MG tablet Take 50 mg by mouth every 6 (six) hours as needed. 03/16/21  Yes [provider]  acetaminophen (TYLENOL) 325 MG tablet Take 325 mg by mouth every 6 (six) hours as needed.    [provider]  azelastine  (ASTELIN) 0.1 % nasal spray azelastine 137 mcg (0.1 %) nasal spray aerosol  Spray 2 sprays twice a day by intranasal route.    [provider]  azelastine (OPTIVAR) 0.05 % ophthalmic solution  04/12/18   [provider]  Azelastine HCl 0.15 % SOLN Place 1 spray into both nostrils 2 (two) times daily. 01/07/20   [provider]  calcium-vitamin D (OSCAL WITH D) 500-200 MG-UNIT per tablet Take 1 tablet by mouth.    [provider]  clotrimazole-betamethasone (LOTRISONE) cream clotrimazole-betamethasone 1 %-0.05 % topical cream  APPLY TOPICALLY TO THE AFFECTED AND SURROUNDING AREAS TWICE DAILY IN THE MORNING AND IN THE EVENING FOR 7 DAYS    [provider]  Continuous Blood Gluc Sensor (FREESTYLE LIBRE 14 DAY SENSOR) MISC CHANGE SENSOR EVERY 14 DAYS 06/25/21   [provider]  fluticasone (FLONASE) 50 MCG/ACT nasal spray Place 2 sprays into the nose daily.    [provider]  Ibuprofen (MOTRIN IB PO) Motrin    [provider]  insulin aspart (NOVOLOG FLEXPEN) 100 UNIT/ML FlexPen Inject 25 Units into the skin 3 (three) times daily with meals. And nano pen needles 3/day Patient taking differently: Inject into the skin 3 (three) times daily with meals. And nano pen needles 3/day Insulin is based on sliding scale and carb based per pt 12/02/17   Renato Shin, MD  meloxicam (MOBIC) 15 MG tablet Take 15 mg by mouth daily. Patient not taking: Reported on 08/11/2021 06/24/21   [provider]  pseudoephedrine (SUDAFED) 30 MG tablet Take 30 mg by mouth every 4 (four) hours as needed for congestion.     [provider]  TRESIBA FLEXTOUCH 100 UNIT/ML FlexTouch Pen 20 Units daily. With breakfast meal per pt 01/26/20   [provider]    Physical Exam: Vitals:   08/24/21 0120 08/24/21 0155 08/24/21 0300 08/24/21 0404  BP:   (!) 143/60 128/64  Pulse:   (!) 115 (!) 110  Resp: (!) 23 15 19 20   Temp:    98.3 F (36.8  C)  TempSrc:    Oral  SpO2:   99% 100%  Weight:      Height:        Constitutional: NAD, calm  Eyes: PERTLA, lids and conjunctivae normal ENMT: Mucous membranes are dry. Posterior pharynx clear of any exudate or lesions.   Neck: supple, no masses  Respiratory: no wheezing, no crackles. No accessory muscle use.  Cardiovascular: Rate ~100 and regular. No extremity edema.   Abdomen: No distension, no tenderness, soft. Bowel sounds active.  Musculoskeletal: no clubbing / cyanosis. No joint deformity upper and lower extremities.   Skin: no significant rashes, lesions, ulcers. Warm, dry, well-perfused.  Neurologic: CN 2-12 grossly intact. Moving all extremities. Alert and oriented.  Psychiatric: Pleasant. Cooperative.    Labs and Imaging on Admission: I have personally reviewed following labs and imaging studies  CBC: Recent Labs  Lab 08/23/21 1132  WBC 13.2*  HGB 12.5  HCT 40.9  MCV 87.0  PLT 798*   Basic Metabolic Panel: Recent Labs  Lab 08/23/21 1132  NA 143  K 3.5  CL 104  CO2 9*  GLUCOSE 197*  BUN 13  CREATININE 0.94  CALCIUM 10.4*   GFR: Estimated Creatinine Clearance: 71.5 mL/min (by C-G formula based on SCr of 0.94 mg/dL). Liver Function Tests: Recent Labs  Lab 08/23/21 1132  AST 20  ALT 18  ALKPHOS 58  BILITOT 0.6  PROT 7.8  ALBUMIN 4.7   Recent Labs  Lab 08/23/21 1132  LIPASE 5*   No results for input(s): AMMONIA in the last 168 hours. Coagulation Profile: No results for input(s): INR, PROTIME in the last 168 hours. Cardiac Enzymes: No results for input(s): CKTOTAL, CKMB, CKMBINDEX, TROPONINI in the last 168 hours. BNP (last 3 results) No results for input(s): PROBNP in the last 8760 hours. HbA1C: No results for input(s): HGBA1C in the last 72 hours. CBG: Recent Labs  Lab 08/23/21 2334 08/24/21 0335  GLUCAP 167* 159*   Lipid Profile: No results for input(s): CHOL, HDL, LDLCALC, TRIG, CHOLHDL, LDLDIRECT in the last 72 hours. Thyroid  Function Tests: No results for input(s): TSH, T4TOTAL, FREET4, T3FREE, THYROIDAB in the last 72 hours. Anemia Panel: No results for input(s): VITAMINB12, FOLATE, FERRITIN, TIBC, IRON, RETICCTPCT in the last 72 hours. Urine analysis:    Component Value Date/Time   COLORURINE COLORLESS (A) 08/23/2021 1304   APPEARANCEUR CLEAR 08/23/2021 1304   LABSPEC 1.017 08/23/2021 1304   PHURINE 5.0 08/23/2021 1304   GLUCOSEU >1,000 (A) 08/23/2021 1304   HGBUR TRACE (A) 08/23/2021 1304   BILIRUBINUR NEGATIVE 08/23/2021 1304   KETONESUR >80 (A) 08/23/2021 1304   PROTEINUR 30 (A) 08/23/2021 1304   NITRITE NEGATIVE 08/23/2021 1304   LEUKOCYTESUR NEGATIVE 08/23/2021 1304   Sepsis Labs: @LABRCNTIP (procalcitonin:4,lacticidven:4) ) Recent Results (from the past 240 hour(s))  Resp Panel by RT-PCR (Flu A&B, Covid) Nasopharyngeal Swab     Status: None   Collection Time: 08/23/21  5:25 PM   Specimen: Nasopharyngeal Swab; Nasopharyngeal(NP) swabs in vial transport medium  Result Value Ref Range Status   SARS Coronavirus 2 by RT PCR NEGATIVE NEGATIVE Final    Comment: (NOTE) SARS-CoV-2 target nucleic acids are NOT DETECTED.  The SARS-CoV-2 RNA is generally detectable in upper respiratory specimens during the acute phase of infection. The lowest concentration of SARS-CoV-2 viral copies this assay can detect is 138 copies/mL. A negative result does not preclude SARS-Cov-2 infection and should not be used as the sole basis for treatment or other patient management decisions. A negative result may occur with  improper specimen collection/handling, submission of specimen other than nasopharyngeal swab, presence of viral mutation(s) within the areas targeted by this assay, and inadequate number of viral copies(<138 copies/mL). A negative result must be combined with clinical observations, patient history, and epidemiological information. The expected result is Negative.  Fact Sheet for Patients:   EntrepreneurPulse.com.au  Fact Sheet for Healthcare Providers:  IncredibleEmployment.be  This test is no t yet approved or cleared by the [place] FDA and  has been authorized for detection and/or diagnosis of SARS-CoV-2 by FDA under an Emergency Use Authorization (EUA). This EUA will remain  in effect (meaning  this test can be used) for the duration of the COVID-19 declaration under Section 564(b)(1) of the Act, 21 U.S.C.section 360bbb-3(b)(1), unless the authorization is terminated  or revoked sooner.       Influenza A by PCR NEGATIVE NEGATIVE Final   Influenza B by PCR NEGATIVE NEGATIVE Final    Comment: (NOTE) The Xpert Xpress SARS-CoV-2/FLU/RSV plus assay is intended as an aid in the diagnosis of influenza from Nasopharyngeal swab specimens and should not be used as a sole basis for treatment. Nasal washings and aspirates are unacceptable for Xpert Xpress SARS-CoV-2/FLU/RSV testing.  Fact Sheet for Patients: EntrepreneurPulse.com.au  Fact Sheet for Healthcare Providers: IncredibleEmployment.be  This test is not yet approved or cleared by the [place] FDA and has been authorized for detection and/or diagnosis of SARS-CoV-2 by FDA under an Emergency Use Authorization (EUA). This EUA will remain in effect (meaning this test can be used) for the duration of the COVID-19 declaration under Section 564(b)(1) of the Act, 21 U.S.C. section 360bbb-3(b)(1), unless the authorization is terminated or revoked.  Performed at [facility], [address], [place], [place] [address]      Radiological Exams on Admission: CT Angio Chest PE W and/or Wo Contrast  Result Date: 08/23/2021 CLINICAL DATA:  Constipation, epigastric pain since yesterday, vomiting today, nausea, diabetes mellitus, hyperlipidemia, former smoker EXAM: CT ANGIOGRAPHY CHEST WITH CONTRAST TECHNIQUE: Multidetector  CT imaging of the chest was performed using the standard protocol during bolus administration of intravenous contrast. Multiplanar CT image reconstructions and MIPs were obtained to evaluate the vascular anatomy. CONTRAST:  11mL OMNIPAQUE IOHEXOL 350 MG/ML SOLN IV COMPARISON:  None FINDINGS: Cardiovascular: Aorta normal caliber with atherosclerotic calcifications. No aortic aneurysm or dissection. Heart unremarkable. No pericardial effusion. Pulmonary arteries adequately opacified and patent. No evidence of pulmonary embolism. Mediastinum/Nodes: Base of cervical region normal appearance. Esophagus unremarkable. Lap band near GE junction. No thoracic adenopathy Lungs/Pleura: Subsegmental atelectasis RIGHT middle lobe medially. Lungs otherwise clear. No pulmonary infiltrate, pleural effusion, or pneumothorax. Questionable 2 mm subpleural nodule LEFT lower lobe image 84. Additional questionable subpleural nodule RIGHT upper lobe 3 mm image 41. Upper Abdomen: Distended stomach below laparoscopic gastric band. Low-attenuation foci within liver question small cysts Musculoskeletal: No acute osseous findings. Review of the MIP images confirms the above findings. IMPRESSION: No evidence of pulmonary embolism. Subsegmental atelectasis RIGHT middle lobe medially. Questionable tiny subpleural nodules in both lungs; no follow-up needed if patient is low-risk (and has no known or suspected primary neoplasm). Non-contrast chest CT can be considered in 12 months if patient is high-risk. This recommendation follows the consensus statement: Guidelines for Management of Incidental Pulmonary Nodules Detected on CT Images: From the Fleischner Society 2017; Radiology 2017; 284:228-243. Electronically Signed   By: Lavonia Dana M.D.   On: 08/23/2021 16:27   CT ABDOMEN PELVIS W CONTRAST  Result Date: 08/23/2021 CLINICAL DATA:  Epigastric pain, nausea, vomiting, prior laparoscopic gastric banding. Recent hip replacement surgery  08/14/2021 EXAM: CT ABDOMEN AND PELVIS WITH CONTRAST TECHNIQUE: Multidetector CT imaging of the abdomen and pelvis was performed using the standard protocol following bolus administration of intravenous contrast. Sagittal and coronal MPR images reconstructed from axial data set. CONTRAST:  178mL OMNIPAQUE IOHEXOL 350 MG/ML SOLN IV. Dilute oral contrast. COMPARISON:  None FINDINGS: Lower chest: Questionable tiny subpleural nodule LEFT lower lobe see CT chest report. Remaining lung bases clear. Hepatobiliary: Small lesions within liver, some representing cysts, additional of which are indeterminate. Unremarkable gallbladder. No biliary dilatation. Pancreas: Atrophic pancreas  without mass Spleen: Normal appearance.  Small splenule inferior to spleen. Adrenals/Urinary Tract: Adrenal glands, kidneys, ureters, and bladder normal appearance Stomach/Bowel: Normal appendix. Prior laparoscopic gastric banding. Question partial small bowel malrotation with majority of small bowel loops in RIGHT upper quadrant. Stomach distal to gastric band is distended by contrast, though contrast has passed into small bowel loops. Decompressed duodenal bulb and descending duodenum. Component of pyloric stenosis or gastric outlet obstruction not completely excluded. Vascular/Lymphatic: Scattered pelvic phleboliths. Atherosclerotic calcifications aorta and iliac arteries without aneurysm. Vascular structures patent. No adenopathy. Reproductive: Multiple uterine nodules likely representing leiomyomata. LEFT lateral leiomyoma posteriorly 2.7 x 3.1 x 3.6 cm. RIGHT superior fundal leiomyoma 2.6 x 2.3 x 2.6 cm. Small nodules less than 1 cm. Additional mass superior to the uterine fundus, favor exophytic leiomyoma 4.3 x 3.7 x 4.0 cm. Atrophic ovaries. Other: No free air or free fluid.  No hernia. Musculoskeletal: RIGHT hip prosthesis. Overlying soft tissue swelling and tissue infiltration consistent with recent surgery. Multiple foci of soft tissue  gas and a small amount of fluid are identified both within and superficial to the muscles, may represent resolving postsurgical changes but infection not excluded. No other osseous abnormalities. IMPRESSION: Prior laparoscopic gastric banding with stomach distal to gastric band distended by contrast, though contrast has passed into small bowel loops; component of pyloric stenosis or gastric outlet obstruction not completely excluded. Question partial small bowel malrotation with majority of small bowel loops in RIGHT upper quadrant. Multiple uterine leiomyomata. Multiple foci of soft tissue gas and a small amount of fluid are identified both within subcutaneous soft tissues and within muscles anterior to the RIGHT hip prosthesis may represent resolving postsurgical changes but infection not excluded by CT. Aortic Atherosclerosis (ICD10-I70.0). Electronically Signed   By: Lavonia Dana M.D.   On: 08/23/2021 16:42    EKG: Independently reviewed. Sinus tachycardia, rate 141, non-specific repolarization abnormality.   Assessment/Plan   1. Abdominal pain with N/V, ?GOO  - Pt with laparoscopic gastric banding in 2005 p/w 2 days epigastric pain and N/V when eating or drinking   - Question of pyloric stenosis or gastric outlet obstruction on CT  - Surgery consulted by ED, appreciate involvement, will follow-up on recommendations, keep NPO and continue supportive care for now    2. Type II DM  - Most recent A1c in EMR was 9.3% in 2019  - Continue CBG checks and insulin    3. Sinus tachycardia  - No PE on CTA chest  - Improved with IVF and pain-control, TSH pending    4. OSA  - CPAP tonight if N/V resolved   5. Lung nodules  - Tiny subpleural nodules noted bilaterally on CTA in ED  - Outpatient follow-up recommended    DVT prophylaxis: SCDs  Code Status: Full  Level of Care: Level of care: Telemetry Family Communication: None present  Disposition Plan:  Patient is from: Home  Anticipated d/c is  to: Home  Anticipated d/c date is: Possibly as early as 08/25/21  Patient currently: Pending surgery consultation, tolerance of adequate oral intake  Consults called: Surgery  Admission status: Observation     Vianne Bulls, MD [facility]  08/24/2021, 5:26 AM

## 2021-08-24 NOTE — Progress Notes (Signed)
Inpatient Diabetes Program Recommendations  AACE/ADA: New Consensus Statement on Inpatient Glycemic Control (2015)  Target Ranges:  Prepandial:   less than 140 mg/dL      Peak postprandial:   less than 180 mg/dL (1-2 hours)      Critically ill patients:  140 - 180 mg/dL   Lab Results  Component Value Date   GLUCAP 125 (H) 08/24/2021   HGBA1C 6.2 (H) 08/24/2021    Review of Glycemic Control  Diabetes history: DM 2 Outpatient Diabetes medications: Novolog 25 units tid, Jardiance 25 mg Daily, Semglutide 2.4 mg Daily, Tresiba 20 units Daily Current orders for Inpatient glycemic control:  IV insulin/Endotool/DKA  Inpatient Diabetes Program Recommendations:    Note: Jardiance listed on home med list, an SGLT-2 known to cause Euglycemic DKA. D/c at time of d/c.  -  Add more dextrose to IV fluids (to support IV insulin dose) if pt can tolerate to clear Euglycemic DKA  Thanks,  Tama Headings RN, MSN, BC-ADM Inpatient Diabetes Coordinator Team Pager [phone] (8a-5p)

## 2021-08-24 NOTE — ED Notes (Signed)
[facility] has arrived for patient 

## 2021-08-24 NOTE — Progress Notes (Addendum)
Progress Note:    Felicia Hayden    EXN:[id number] DOB: Dec 02, 1960 DOA: 08/23/2021  PCP: Jonathon Jordan, MD    Brief Narrative:   60 year old female with a past medical history of insulin-dependent diabetes mellitus, OSA, laparoscopic gastric banding in 2005, recent right total hip arthroplasty on 08/14/2021.  The patient presented to the emergency department due to epigastric abdominal pain, nausea and vomiting.  Stated her hip pain was well controlled and she was ambulating at home without any difficulty.  States that her hip pain is well controlled and she was ambulating without difficulty.  States that her hip pain is well controlled and she is ambulating without difficulty.     Subjective:   No acute events overnight.  Patient seen and examined at bedside this morning.  She states her abdominal pain is resolved.  No nausea or vomiting while in the hospital.  Last bowel movement was Friday.  Not passing any flatus.  Stating that she has a very dry mouth.  Asking for ice chips.    Assessment and Plan:   Intractable abdominal pain, nausea, vomiting likely secondary to gastric ileus: General surgery consulted and management per general surgery. NPO currently.  Imaging noted concern for possible pyloric stenosis or gastric outlet obstruction on CT.  Continue supportive care.  Euglycemic DKA: Improvement in her anion gap from 32 now down to 15.  Blood gas was obtained and confirmed a pH of 7.1.  Bicarb of 8.  She is known to be on a SGLT2 inhibitor.  We will transfer the patient to the stepdown unit and started insulin drip.  Will start D5-LR infusion.  BMP every 4 hours.  Accu-Cheks every 1 hour to titrate insulin drip.  Monitor serum potassium level closely.  DC SGLT2 inhibitor upon discharge.  Hold home Tresiba, NovoLog and semaglutide.  OSA: Resume CPAP once nausea vomiting resolved  Lung nodules: Tiny subpleural nodules noted bilaterally on CTA in ED.  Outpatient  follow-up recommended.    Other information:    DVT prophylaxis: SCDs Code Status: Full code Family Communication: None present Disposition:   Status is: Inpatient  Remains inpatient appropriate because:Ongoing diagnostic testing needed not appropriate for outpatient work up  Dispo: The patient is from: Home              Anticipated d/c is to: Home              Patient currently is not medically stable to d/c.   Difficult to place patient No           Consultants:   General surgery, diabetes coordinator    Objective:    Vitals:   08/24/21 1338 08/24/21 1357 08/24/21 1400 08/24/21 1614  BP:  (!) 177/67 (!) 172/51   Pulse:  (!) 109 (!) 109   Resp:  17 17   Temp: 98.4 F (36.9 C) 98.4 F (36.9 C)  99.2 F (37.3 C)  TempSrc: Oral Oral  Axillary  SpO2:  100% 100%   Weight:      Height:        Intake/Output Summary (Last 24 hours) at 08/24/2021 1625 Last data filed at 08/24/2021 1400 Gross per 24 hour  Intake 260.66 ml  Output --  Net 260.66 ml   Filed Weights   08/23/21 1123  Weight: 91.2 kg       Physical Exam:    General exam: Appears calm and comfortable  Respiratory system: Clear to auscultation. Respiratory effort normal.  Cardiovascular system: S1 & S2 heard, RRR. No JVD, murmurs, rubs, gallops or clicks. No pedal edema. Gastrointestinal system: Soft, nontender, obese abdomen with no bowel sounds present Central nervous system: Alert and oriented. No focal neurological deficits. Extremities: Symmetric 5 x 5 power. Skin: No rashes, lesions or ulcers Psychiatry: Judgement and insight appear normal. Mood & affect appropriate.     Data Reviewed:    I have personally reviewed following labs and imaging studies  CBC: Recent Labs  Lab 08/23/21 1132 08/24/21 0521  WBC 13.2* 14.6*  HGB 12.5 10.4*  HCT 40.9 34.5*  MCV 87.0 88.9  PLT 497* 914    Basic Metabolic Panel: Recent Labs  Lab 08/23/21 1132 08/24/21 0521  08/24/21 1331  NA 143 137 139  K 3.5 3.5 3.3*  CL 104 114* 116*  CO2 9* 8* 8*  GLUCOSE 197* 174* 150*  BUN 13 11 11   CREATININE 0.94 0.82 0.77  CALCIUM 10.4* 8.6* 8.8*  MG  --  2.2  --     GFR: Estimated Creatinine Clearance: 84.1 mL/min (by C-G formula based on SCr of 0.77 mg/dL).  Liver Function Tests: Recent Labs  Lab 08/23/21 1132  AST 20  ALT 18  ALKPHOS 58  BILITOT 0.6  PROT 7.8  ALBUMIN 4.7    CBG: Recent Labs  Lab 08/24/21 1131 08/24/21 1331 08/24/21 1359 08/24/21 1507 08/24/21 1603  GLUCAP 143* 126* 125* 153* 148*     Recent Results (from the past 240 hour(s))  Culture, blood (single) w Reflex to ID Panel     Status: None (Preliminary result)   Collection Time: 08/23/21  3:13 PM   Specimen: BLOOD  Result Value Ref Range Status   Specimen Description   Final    BLOOD BLOOD LEFT FOREARM Performed at [facility], [address], [place], [place] [address]    Special Requests   Final    BOTTLES DRAWN AEROBIC AND ANAEROBIC Blood Culture adequate volume Performed at [facility], [address], [place], [place] [address]    Culture   Final    NO GROWTH < 24 HOURS Performed at [facility], [place] [address] [place], [place] [address]    Report Status PENDING  Incomplete  Resp Panel by RT-PCR (Flu A&B, Covid) Nasopharyngeal Swab     Status: None   Collection Time: 08/23/21  5:25 PM   Specimen: Nasopharyngeal Swab; Nasopharyngeal(NP) swabs in vial transport medium  Result Value Ref Range Status   SARS Coronavirus 2 by RT PCR NEGATIVE NEGATIVE Final    Comment: (NOTE) SARS-CoV-2 target nucleic acids are NOT DETECTED.  The SARS-CoV-2 RNA is generally detectable in upper respiratory specimens during the acute phase of infection. The lowest concentration of SARS-CoV-2 viral copies this assay can detect is 138 copies/mL. A negative result does not preclude SARS-Cov-2 infection and should not be used as the  sole basis for treatment or other patient management decisions. A negative result may occur with  improper specimen collection/handling, submission of specimen other than nasopharyngeal swab, presence of viral mutation(s) within the areas targeted by this assay, and inadequate number of viral copies(<138 copies/mL). A negative result must be combined with clinical observations, patient history, and epidemiological information. The expected result is Negative.  Fact Sheet for Patients:  EntrepreneurPulse.com.au  Fact Sheet for Healthcare Providers:  IncredibleEmployment.be  This test is no t yet approved or cleared by the [place] FDA and  has been authorized for detection and/or diagnosis of SARS-CoV-2  by FDA under an Emergency Use Authorization (EUA). This EUA will remain  in effect (meaning this test can be used) for the duration of the COVID-19 declaration under Section 564(b)(1) of the Act, 21 U.S.C.section 360bbb-3(b)(1), unless the authorization is terminated  or revoked sooner.       Influenza A by PCR NEGATIVE NEGATIVE Final   Influenza B by PCR NEGATIVE NEGATIVE Final    Comment: (NOTE) The Xpert Xpress SARS-CoV-2/FLU/RSV plus assay is intended as an aid in the diagnosis of influenza from Nasopharyngeal swab specimens and should not be used as a sole basis for treatment. Nasal washings and aspirates are unacceptable for Xpert Xpress SARS-CoV-2/FLU/RSV testing.  Fact Sheet for Patients: EntrepreneurPulse.com.au  Fact Sheet for Healthcare Providers: IncredibleEmployment.be  This test is not yet approved or cleared by the [place] FDA and has been authorized for detection and/or diagnosis of SARS-CoV-2 by FDA under an Emergency Use Authorization (EUA). This EUA will remain in effect (meaning this test can be used) for the duration of the COVID-19 declaration under Section 564(b)(1) of the  Act, 21 U.S.C. section 360bbb-3(b)(1), unless the authorization is terminated or revoked.  Performed at [facility], [address], [place], [place] [address]   MRSA Next Gen by PCR, Nasal     Status: None   Collection Time: 08/24/21  1:24 PM   Specimen: Nasal Mucosa; Nasal Swab  Result Value Ref Range Status   MRSA by PCR Next Gen NOT DETECTED NOT DETECTED Final    Comment: (NOTE) The GeneXpert MRSA Assay (FDA approved for NASAL specimens only), is one component of a comprehensive MRSA colonization surveillance program. It is not intended to diagnose MRSA infection nor to guide or monitor treatment for MRSA infections. Test performance is not FDA approved in patients less than 64 years old. Performed at [facility], [place] [address], [place], [place] [address]          Radiology Studies:    CT Angio Chest PE W and/or Wo Contrast  Result Date: 08/23/2021 CLINICAL DATA:  Constipation, epigastric pain since yesterday, vomiting today, nausea, diabetes mellitus, hyperlipidemia, former smoker EXAM: CT ANGIOGRAPHY CHEST WITH CONTRAST TECHNIQUE: Multidetector CT imaging of the chest was performed using the standard protocol during bolus administration of intravenous contrast. Multiplanar CT image reconstructions and MIPs were obtained to evaluate the vascular anatomy. CONTRAST:  186mL OMNIPAQUE IOHEXOL 350 MG/ML SOLN IV COMPARISON:  None FINDINGS: Cardiovascular: Aorta normal caliber with atherosclerotic calcifications. No aortic aneurysm or dissection. Heart unremarkable. No pericardial effusion. Pulmonary arteries adequately opacified and patent. No evidence of pulmonary embolism. Mediastinum/Nodes: Base of cervical region normal appearance. Esophagus unremarkable. Lap band near GE junction. No thoracic adenopathy Lungs/Pleura: Subsegmental atelectasis RIGHT middle lobe medially. Lungs otherwise clear. No pulmonary infiltrate, pleural effusion, or  pneumothorax. Questionable 2 mm subpleural nodule LEFT lower lobe image 84. Additional questionable subpleural nodule RIGHT upper lobe 3 mm image 41. Upper Abdomen: Distended stomach below laparoscopic gastric band. Low-attenuation foci within liver question small cysts Musculoskeletal: No acute osseous findings. Review of the MIP images confirms the above findings. IMPRESSION: No evidence of pulmonary embolism. Subsegmental atelectasis RIGHT middle lobe medially. Questionable tiny subpleural nodules in both lungs; no follow-up needed if patient is low-risk (and has no known or suspected primary neoplasm). Non-contrast chest CT can be considered in 12 months if patient is high-risk. This recommendation follows the consensus statement: Guidelines for Management of Incidental Pulmonary Nodules Detected on CT Images: From the Fleischner Society 2017;  Radiology 2017; [id number]. Electronically Signed   By: Lavonia Dana M.D.   On: 08/23/2021 16:27   CT ABDOMEN PELVIS W CONTRAST  Result Date: 08/23/2021 CLINICAL DATA:  Epigastric pain, nausea, vomiting, prior laparoscopic gastric banding. Recent hip replacement surgery 08/14/2021 EXAM: CT ABDOMEN AND PELVIS WITH CONTRAST TECHNIQUE: Multidetector CT imaging of the abdomen and pelvis was performed using the standard protocol following bolus administration of intravenous contrast. Sagittal and coronal MPR images reconstructed from axial data set. CONTRAST:  187mL OMNIPAQUE IOHEXOL 350 MG/ML SOLN IV. Dilute oral contrast. COMPARISON:  None FINDINGS: Lower chest: Questionable tiny subpleural nodule LEFT lower lobe see CT chest report. Remaining lung bases clear. Hepatobiliary: Small lesions within liver, some representing cysts, additional of which are indeterminate. Unremarkable gallbladder. No biliary dilatation. Pancreas: Atrophic pancreas without mass Spleen: Normal appearance.  Small splenule inferior to spleen. Adrenals/Urinary Tract: Adrenal glands, kidneys,  ureters, and bladder normal appearance Stomach/Bowel: Normal appendix. Prior laparoscopic gastric banding. Question partial small bowel malrotation with majority of small bowel loops in RIGHT upper quadrant. Stomach distal to gastric band is distended by contrast, though contrast has passed into small bowel loops. Decompressed duodenal bulb and descending duodenum. Component of pyloric stenosis or gastric outlet obstruction not completely excluded. Vascular/Lymphatic: Scattered pelvic phleboliths. Atherosclerotic calcifications aorta and iliac arteries without aneurysm. Vascular structures patent. No adenopathy. Reproductive: Multiple uterine nodules likely representing leiomyomata. LEFT lateral leiomyoma posteriorly 2.7 x 3.1 x 3.6 cm. RIGHT superior fundal leiomyoma 2.6 x 2.3 x 2.6 cm. Small nodules less than 1 cm. Additional mass superior to the uterine fundus, favor exophytic leiomyoma 4.3 x 3.7 x 4.0 cm. Atrophic ovaries. Other: No free air or free fluid.  No hernia. Musculoskeletal: RIGHT hip prosthesis. Overlying soft tissue swelling and tissue infiltration consistent with recent surgery. Multiple foci of soft tissue gas and a small amount of fluid are identified both within and superficial to the muscles, may represent resolving postsurgical changes but infection not excluded. No other osseous abnormalities. IMPRESSION: Prior laparoscopic gastric banding with stomach distal to gastric band distended by contrast, though contrast has passed into small bowel loops; component of pyloric stenosis or gastric outlet obstruction not completely excluded. Question partial small bowel malrotation with majority of small bowel loops in RIGHT upper quadrant. Multiple uterine leiomyomata. Multiple foci of soft tissue gas and a small amount of fluid are identified both within subcutaneous soft tissues and within muscles anterior to the RIGHT hip prosthesis may represent resolving postsurgical changes but infection not  excluded by CT. Aortic Atherosclerosis (ICD10-I70.0). Electronically Signed   By: Lavonia Dana M.D.   On: 08/23/2021 16:42        Medications:    Scheduled Meds:  Chlorhexidine Gluconate Cloth  6 each Topical Daily   Continuous Infusions:  sodium chloride 500 mL (08/24/21 1425)   dextrose 5% lactated ringers 125 mL/hr at 08/24/21 1400   insulin 2.4 Units/hr (08/24/21 1407)   lactated ringers     potassium chloride 10 mEq (08/24/21 1546)       LOS: 0 days    Time spent: 35-minutes    Leslee Home, MD [facility]   To contact the attending provider between 7A-7P or the covering provider during after hours 7P-7A, please log into the web site [email or web address] and access using universal [place] password for that web site. If you do not have the password, please call the hospital operator.  08/24/2021, 4:25 PM

## 2021-08-25 ENCOUNTER — Inpatient Hospital Stay (HOSPITAL_COMMUNITY): Payer: BC Managed Care – PPO

## 2021-08-25 DIAGNOSIS — M7989 Other specified soft tissue disorders: Secondary | ICD-10-CM

## 2021-08-25 DIAGNOSIS — R109 Unspecified abdominal pain: Secondary | ICD-10-CM | POA: Diagnosis not present

## 2021-08-25 DIAGNOSIS — R1110 Vomiting, unspecified: Secondary | ICD-10-CM | POA: Diagnosis not present

## 2021-08-25 DIAGNOSIS — M79604 Pain in right leg: Secondary | ICD-10-CM

## 2021-08-25 LAB — BASIC METABOLIC PANEL
Anion gap: 10 (ref 5–15)
Anion gap: 8 (ref 5–15)
Anion gap: 8 (ref 5–15)
BUN: 10 mg/dL (ref 8–23)
BUN: 9 mg/dL (ref 8–23)
BUN: 9 mg/dL (ref 8–23)
CO2: 14 mmol/L — ABNORMAL LOW (ref 22–32)
CO2: 17 mmol/L — ABNORMAL LOW (ref 22–32)
CO2: 20 mmol/L — ABNORMAL LOW (ref 22–32)
Calcium: 8.7 mg/dL — ABNORMAL LOW (ref 8.9–10.3)
Calcium: 9 mg/dL (ref 8.9–10.3)
Calcium: 9 mg/dL (ref 8.9–10.3)
Chloride: 112 mmol/L — ABNORMAL HIGH (ref 98–111)
Chloride: 116 mmol/L — ABNORMAL HIGH (ref 98–111)
Chloride: 118 mmol/L — ABNORMAL HIGH (ref 98–111)
Creatinine, Ser: 0.59 mg/dL (ref 0.44–1.00)
Creatinine, Ser: 0.6 mg/dL (ref 0.44–1.00)
Creatinine, Ser: 0.61 mg/dL (ref 0.44–1.00)
GFR, Estimated: >60 mL/min (ref >60)
GFR, Estimated: >60 mL/min (ref >60)
GFR, Estimated: >60 mL/min (ref >60)
Glucose, Bld: 144 mg/dL — ABNORMAL HIGH (ref 70–99)
Glucose, Bld: 158 mg/dL — ABNORMAL HIGH (ref 70–99)
Glucose, Bld: 278 mg/dL — ABNORMAL HIGH (ref 70–99)
Potassium: 2.5 mmol/L — CL (ref 3.5–5.1)
Potassium: 2.6 mmol/L — CL (ref 3.5–5.1)
Potassium: 2.8 mmol/L — ABNORMAL LOW (ref 3.5–5.1)
Sodium: 140 mmol/L (ref 135–145)
Sodium: 141 mmol/L (ref 135–145)
Sodium: 142 mmol/L (ref 135–145)

## 2021-08-25 LAB — CBC
HCT: 30.4 % — ABNORMAL LOW (ref 36.0–46.0)
Hemoglobin: 9.7 g/dL — ABNORMAL LOW (ref 12.0–15.0)
MCH: 26.8 pg (ref 26.0–34.0)
MCHC: 31.9 g/dL (ref 30.0–36.0)
MCV: 84 fL (ref 80.0–100.0)
Platelets: 303 10*3/uL (ref 150–400)
RBC: 3.62 MIL/uL — ABNORMAL LOW (ref 3.87–5.11)
RDW: 15.3 % (ref 11.5–15.5)
WBC: 7.6 10*3/uL (ref 4.0–10.5)
nRBC: 0 % (ref 0.0–0.2)

## 2021-08-25 LAB — GLUCOSE, CAPILLARY
Glucose-Capillary: 127 mg/dL — ABNORMAL HIGH (ref 70–99)
Glucose-Capillary: 132 mg/dL — ABNORMAL HIGH (ref 70–99)
Glucose-Capillary: 143 mg/dL — ABNORMAL HIGH (ref 70–99)
Glucose-Capillary: 148 mg/dL — ABNORMAL HIGH (ref 70–99)
Glucose-Capillary: 148 mg/dL — ABNORMAL HIGH (ref 70–99)
Glucose-Capillary: 157 mg/dL — ABNORMAL HIGH (ref 70–99)
Glucose-Capillary: 158 mg/dL — ABNORMAL HIGH (ref 70–99)
Glucose-Capillary: 163 mg/dL — ABNORMAL HIGH (ref 70–99)
Glucose-Capillary: 166 mg/dL — ABNORMAL HIGH (ref 70–99)
Glucose-Capillary: 251 mg/dL — ABNORMAL HIGH (ref 70–99)
Glucose-Capillary: 94 mg/dL (ref 70–99)

## 2021-08-25 LAB — MAGNESIUM: Magnesium: 2.4 mg/dL (ref 1.7–2.4)

## 2021-08-25 LAB — BETA-HYDROXYBUTYRIC ACID: Beta-Hydroxybutyric Acid: 2.72 mmol/L — ABNORMAL HIGH (ref 0.05–0.27)

## 2021-08-25 LAB — TSH: TSH: 0.743 u[IU]/mL (ref 0.350–4.500)

## 2021-08-25 MED ORDER — ENOXAPARIN SODIUM 40 MG/0.4ML IJ SOSY
40.0000 mg | PREFILLED_SYRINGE | Freq: Every day | INTRAMUSCULAR | Status: DC
  Administered 2021-08-25 – 2021-08-28 (×4): 40 mg via SUBCUTANEOUS
  Filled 2021-08-25 (×4): qty 0.4

## 2021-08-25 MED ORDER — POTASSIUM CHLORIDE CRYS ER 20 MEQ PO TBCR
40.0000 meq | EXTENDED_RELEASE_TABLET | Freq: Two times a day (BID) | ORAL | Status: AC
  Administered 2021-08-25 (×2): 40 meq via ORAL
  Filled 2021-08-25 (×2): qty 2

## 2021-08-25 MED ORDER — INSULIN GLARGINE-YFGN 100 UNIT/ML ~~LOC~~ SOLN
20.0000 [IU] | Freq: Every day | SUBCUTANEOUS | Status: DC
  Administered 2021-08-26 – 2021-08-28 (×3): 20 [IU] via SUBCUTANEOUS
  Filled 2021-08-25 (×4): qty 0.2

## 2021-08-25 MED ORDER — INSULIN ASPART 100 UNIT/ML IJ SOLN
0.0000 [IU] | Freq: Every day | INTRAMUSCULAR | Status: DC
  Administered 2021-08-26: 2 [IU] via SUBCUTANEOUS

## 2021-08-25 MED ORDER — POTASSIUM CHLORIDE 10 MEQ/100ML IV SOLN
10.0000 meq | INTRAVENOUS | Status: DC
  Administered 2021-08-25: 10 meq via INTRAVENOUS
  Filled 2021-08-25 (×2): qty 100

## 2021-08-25 MED ORDER — CALCIUM CARBONATE ANTACID 500 MG PO CHEW
1.0000 | CHEWABLE_TABLET | Freq: Once | ORAL | Status: AC
  Administered 2021-08-25: 200 mg via ORAL
  Filled 2021-08-25: qty 1

## 2021-08-25 MED ORDER — POTASSIUM CHLORIDE 20 MEQ PO PACK
80.0000 meq | PACK | Freq: Once | ORAL | Status: AC
  Administered 2021-08-25: 80 meq via ORAL
  Filled 2021-08-25: qty 4

## 2021-08-25 MED ORDER — POTASSIUM CHLORIDE 10 MEQ/100ML IV SOLN
10.0000 meq | INTRAVENOUS | Status: DC
Start: 2021-08-25 — End: 2021-08-25

## 2021-08-25 MED ORDER — INSULIN ASPART 100 UNIT/ML IJ SOLN
0.0000 [IU] | Freq: Three times a day (TID) | INTRAMUSCULAR | Status: DC
  Administered 2021-08-25: 3 [IU] via SUBCUTANEOUS
  Administered 2021-08-26: 4 [IU] via SUBCUTANEOUS
  Administered 2021-08-26: 3 [IU] via SUBCUTANEOUS
  Administered 2021-08-27 – 2021-08-28 (×2): 4 [IU] via SUBCUTANEOUS

## 2021-08-25 MED ORDER — INSULIN DETEMIR 100 UNIT/ML ~~LOC~~ SOLN
10.0000 [IU] | Freq: Two times a day (BID) | SUBCUTANEOUS | Status: DC
  Administered 2021-08-25: 10 [IU] via SUBCUTANEOUS
  Filled 2021-08-25: qty 0.1

## 2021-08-25 MED ORDER — INSULIN GLARGINE-YFGN 100 UNIT/ML ~~LOC~~ SOLN: 10.0000 [IU] | Freq: Every day | SUBCUTANEOUS | Status: DC

## 2021-08-25 MED ORDER — INSULIN ASPART 100 UNIT/ML IJ SOLN
0.0000 [IU] | Freq: Three times a day (TID) | INTRAMUSCULAR | Status: DC
  Administered 2021-08-25: 8 [IU] via SUBCUTANEOUS

## 2021-08-25 MED ORDER — POTASSIUM CHLORIDE 10 MEQ/100ML IV SOLN
10.0000 meq | INTRAVENOUS | Status: AC
  Administered 2021-08-25 (×3): 10 meq via INTRAVENOUS
  Filled 2021-08-25 (×3): qty 100

## 2021-08-25 NOTE — Progress Notes (Signed)
Patient transitioning off of insulin drip, per MD, okay to keep insulin and IVF off due to incompatibility and only 1 IV access, IV levemir requested from pharmacy to administer it as soon as possible.

## 2021-08-25 NOTE — Progress Notes (Signed)
Right lower extremity venous duplex has been completed. Preliminary results can be found in CV Proc through chart review.   08/25/21 2:45 PM Carlos Levering RVT

## 2021-08-25 NOTE — Progress Notes (Signed)
Patient's IV's infiltrated, insulin and maintenance fluids paused at this time. STAT IV team consult placed, per patient, she requires [place] and is a difficult stick. MD made aware.

## 2021-08-25 NOTE — Progress Notes (Signed)
Patient declines the use of nocturnal CPAP tonight. She is aware that someone will check with her again tomorrow and that she may request RT assistance at anytime prior to that should she change her mind.

## 2021-08-25 NOTE — TOC Initial Note (Signed)
Transition of Care [facility]) - Initial/Assessment Note    Patient Details  Name: Felicia Hayden MRN: [id number] Date of Birth: [birth year]  [facility] CM/SW Contact:    Leeroy Cha, RN Phone Number: 08/25/2021, 7:46 AM  Clinical Narrative:                 60 year old female with a past medical history of insulin-dependent diabetes mellitus, OSA, laparoscopic gastric banding in 2005, recent right total hip arthroplasty on 08/14/2021.  The patient presented to the emergency department due to epigastric abdominal pain, nausea and vomiting.  Stated her hip pain was well controlled and she was ambulating at home without any difficulty.  States that her hip pain is well controlled and she was ambulating without difficulty.  States that her hip pain is well controlled and she is ambulating without difficulty.         Subjective:    No acute events overnight.  Patient seen and examined at bedside this morning.  She states her abdominal pain is resolved.  No nausea or vomiting while in the hospital.  Last bowel movement was Friday.  Not passing any flatus.  Stating that she has a very dry mouth.  Asking for ice chips.       Assessment and Plan:    Intractable abdominal pain, nausea, vomiting likely secondary to gastric ileus: General surgery consulted and management per general surgery. NPO currently.  Imaging noted concern for possible pyloric stenosis or gastric outlet obstruction on CT.  Continue supportive care.   Euglycemic DKA: Improvement in her anion gap from 32 now down to 15.  Blood gas was obtained and confirmed a pH of 7.1.  Bicarb of 8.  She is known to be on a SGLT2 inhibitor.  We will transfer the patient to the stepdown unit and started insulin drip.  Will start D5-LR infusion.  BMP every 4 hours.  Accu-Cheks every 1 hour to titrate insulin drip.  Monitor serum potassium level closely.  DC SGLT2 inhibitor upon discharge.  Hold home Tresiba, NovoLog and  semaglutide.   OSA: Resume CPAP once nausea vomiting resolved   Lung nodules: Tiny subpleural nodules noted bilaterally on CTA in ED.  Outpatient follow-up recommended.  TOC PLAN OF CARE: Iv insulin protocol, K+=2.8-Kruns x4, Following for progression and toc needs, has been seen by the diabetic coordinator.  Expected Discharge Plan: Home/Self Care Barriers to Discharge: Continued Medical Work up   Patient Goals and CMS Choice Patient states their goals for this hospitalization and ongoing recovery are:: to return to my home CMS Medicare.gov Compare Post Acute Care list provided to:: Patient    Expected Discharge Plan and Services Expected Discharge Plan: Home/Self Care   Discharge Planning Services: CM Consult   Living arrangements for the past 2 months: Single Family Home                                      Prior Living Arrangements/Services Living arrangements for the past 2 months: Single Family Home Lives with:: Spouse Patient language and need for interpreter reviewed:: Yes Do you feel safe going back to the place where you live?: Yes      Need for Family Participation in Patient Care: Yes (Comment) Care giver support system in place?: Yes (comment)   Criminal Activity/Legal Involvement Pertinent to Current Situation/Hospitalization: No - Comment as needed  Activities of Daily Living  Home Assistive Devices/Equipment: Bedside commode/3-in-1, CBG Meter, Reacher, Raised toilet seat with rails ADL Screening (condition at time of admission) Is the patient deaf or have difficulty hearing?: No Does the patient have difficulty seeing, even when wearing glasses/contacts?: No Does the patient have difficulty concentrating, remembering, or making decisions?: No Does the patient have difficulty dressing or bathing?: No Does the patient have difficulty walking or climbing stairs?: Yes  Permission Sought/Granted                  Emotional  Assessment Appearance:: Appears stated age     Orientation: : Oriented to Self, Oriented to Place, Oriented to  Time, Oriented to Situation Alcohol / Substance Use: Not Applicable Psych Involvement: No (comment)  Admission diagnosis:  Dehydration [E86.0] Tachycardia [R00.0] Patient Active Problem List   Diagnosis Date Noted   Abdominal pain with vomiting 08/24/2021   Incidental lung nodule 08/24/2021   Tachycardia 08/23/2021   Contraception management 07/02/2021   Herpes simplex type 1 infection 07/02/2021   Perimenopausal disorder 07/02/2021   Pruritus of vulva 07/02/2021   Obstructive sleep apnea syndrome 06/12/2021   Scapulalgia 10/15/2020   Pain in joint of right shoulder 09/26/2020   Scoliosis deformity of spine 11/28/2018   Osteoarthritis of first carpometacarpal joint of right hand 10/06/2018   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 03/07/2018   Lumbar radiculopathy 03/07/2018   Vitamin D deficiency 03/07/2018   Insulin-requiring or dependent type II diabetes mellitus ([place]) 08/29/2017   Uterine leiomyoma 11/16/2011   PCP:  Jonathon Jordan, MD Pharmacy:   [facility] [place], [place] - [address] AT [place] [address] Phone: [phone] Fax: [phone]  [facility] [place], [place] AT [facility] [employer] [facility] [place] [address] Phone: [phone] Fax: [phone]     Social Determinants of Health (SDOH) Interventions    Readmission Risk Interventions No flowsheet data found.

## 2021-08-25 NOTE — Progress Notes (Signed)
Subjective/Chief Complaint: PT doing well this AM No n/v No BMs Kub noted   Objective: Vital signs in last 24 hours: Temp:  [98.4 F (36.9 C)-99.5 F (37.5 C)] 99.2 F (37.3 C) (10/11 0400) Pulse Rate:  [76-109] 78 (10/11 0600) Resp:  [12-23] 17 (10/11 0600) BP: (112-177)/(43-69) 154/61 (10/11 0600) SpO2:  [98 %-100 %] 100 % (10/11 0600) Last BM Date:  (PTA)  Intake/Output from previous day: 10/10 0701 - 10/11 0700 In: 2791.9 [I.V.:2293.6; IV Piggyback:498.4] Out: 425 [Urine:425] Intake/Output this shift: No intake/output data recorded.  PE:  Constitutional: No acute distress, conversant, appears states age. Eyes: Anicteric sclerae, moist conjunctiva, no lid lag Lungs: Clear to auscultation bilaterally, normal respiratory effort CV: regular rate and rhythm, no murmurs, no peripheral edema, pedal pulses 2+ GI: Soft, no masses or hepatosplenomegaly, non-tender to palpation Skin: No rashes, palpation reveals normal turgor Psychiatric: appropriate judgment and insight, oriented to person, place, and time   Lab Results:  Recent Labs    08/24/21 0521 08/25/21 0448  WBC 14.6* 7.6  HGB 10.4* 9.7*  HCT 34.5* 30.4*  PLT 382 303   BMET Recent Labs    08/25/21 0002 08/25/21 0448  NA 142 141  K 2.6* 2.8*  CL 118* 116*  CO2 14* 17*  GLUCOSE 144* 158*  BUN 10 9  CREATININE 0.60 0.59  CALCIUM 9.0 9.0   PT/INR No results for input(s): LABPROT, INR in the last 72 hours. ABG Recent Labs    08/24/21 1024  PHART 7.145*  HCO3 6.2*    Studies/Results: CT Angio Chest PE W and/or Wo Contrast  Result Date: 08/23/2021 CLINICAL DATA:  Constipation, epigastric pain since yesterday, vomiting today, nausea, diabetes mellitus, hyperlipidemia, former smoker EXAM: CT ANGIOGRAPHY CHEST WITH CONTRAST TECHNIQUE: Multidetector CT imaging of the chest was performed using the standard protocol during bolus administration of intravenous contrast. Multiplanar CT image  reconstructions and MIPs were obtained to evaluate the vascular anatomy. CONTRAST:  180mL OMNIPAQUE IOHEXOL 350 MG/ML SOLN IV COMPARISON:  None FINDINGS: Cardiovascular: Aorta normal caliber with atherosclerotic calcifications. No aortic aneurysm or dissection. Heart unremarkable. No pericardial effusion. Pulmonary arteries adequately opacified and patent. No evidence of pulmonary embolism. Mediastinum/Nodes: Base of cervical region normal appearance. Esophagus unremarkable. Lap band near GE junction. No thoracic adenopathy Lungs/Pleura: Subsegmental atelectasis RIGHT middle lobe medially. Lungs otherwise clear. No pulmonary infiltrate, pleural effusion, or pneumothorax. Questionable 2 mm subpleural nodule LEFT lower lobe image 84. Additional questionable subpleural nodule RIGHT upper lobe 3 mm image 41. Upper Abdomen: Distended stomach below laparoscopic gastric band. Low-attenuation foci within liver question small cysts Musculoskeletal: No acute osseous findings. Review of the MIP images confirms the above findings. IMPRESSION: No evidence of pulmonary embolism. Subsegmental atelectasis RIGHT middle lobe medially. Questionable tiny subpleural nodules in both lungs; no follow-up needed if patient is low-risk (and has no known or suspected primary neoplasm). Non-contrast chest CT can be considered in 12 months if patient is high-risk. This recommendation follows the consensus statement: Guidelines for Management of Incidental Pulmonary Nodules Detected on CT Images: From the Fleischner Society 2017; Radiology 2017; 284:228-243. Electronically Signed   By: Lavonia Dana M.D.   On: 08/23/2021 16:27   CT ABDOMEN PELVIS W CONTRAST  Result Date: 08/23/2021 CLINICAL DATA:  Epigastric pain, nausea, vomiting, prior laparoscopic gastric banding. Recent hip replacement surgery 08/14/2021 EXAM: CT ABDOMEN AND PELVIS WITH CONTRAST TECHNIQUE: Multidetector CT imaging of the abdomen and pelvis was performed using the standard  protocol following bolus administration of  intravenous contrast. Sagittal and coronal MPR images reconstructed from axial data set. CONTRAST:  112mL OMNIPAQUE IOHEXOL 350 MG/ML SOLN IV. Dilute oral contrast. COMPARISON:  None FINDINGS: Lower chest: Questionable tiny subpleural nodule LEFT lower lobe see CT chest report. Remaining lung bases clear. Hepatobiliary: Small lesions within liver, some representing cysts, additional of which are indeterminate. Unremarkable gallbladder. No biliary dilatation. Pancreas: Atrophic pancreas without mass Spleen: Normal appearance.  Small splenule inferior to spleen. Adrenals/Urinary Tract: Adrenal glands, kidneys, ureters, and bladder normal appearance Stomach/Bowel: Normal appendix. Prior laparoscopic gastric banding. Question partial small bowel malrotation with majority of small bowel loops in RIGHT upper quadrant. Stomach distal to gastric band is distended by contrast, though contrast has passed into small bowel loops. Decompressed duodenal bulb and descending duodenum. Component of pyloric stenosis or gastric outlet obstruction not completely excluded. Vascular/Lymphatic: Scattered pelvic phleboliths. Atherosclerotic calcifications aorta and iliac arteries without aneurysm. Vascular structures patent. No adenopathy. Reproductive: Multiple uterine nodules likely representing leiomyomata. LEFT lateral leiomyoma posteriorly 2.7 x 3.1 x 3.6 cm. RIGHT superior fundal leiomyoma 2.6 x 2.3 x 2.6 cm. Small nodules less than 1 cm. Additional mass superior to the uterine fundus, favor exophytic leiomyoma 4.3 x 3.7 x 4.0 cm. Atrophic ovaries. Other: No free air or free fluid.  No hernia. Musculoskeletal: RIGHT hip prosthesis. Overlying soft tissue swelling and tissue infiltration consistent with recent surgery. Multiple foci of soft tissue gas and a small amount of fluid are identified both within and superficial to the muscles, may represent resolving postsurgical changes but  infection not excluded. No other osseous abnormalities. IMPRESSION: Prior laparoscopic gastric banding with stomach distal to gastric band distended by contrast, though contrast has passed into small bowel loops; component of pyloric stenosis or gastric outlet obstruction not completely excluded. Question partial small bowel malrotation with majority of small bowel loops in RIGHT upper quadrant. Multiple uterine leiomyomata. Multiple foci of soft tissue gas and a small amount of fluid are identified both within subcutaneous soft tissues and within muscles anterior to the RIGHT hip prosthesis may represent resolving postsurgical changes but infection not excluded by CT. Aortic Atherosclerosis (ICD10-I70.0). Electronically Signed   By: Lavonia Dana M.D.   On: 08/23/2021 16:42   DG Abd Portable 1V  Result Date: 08/25/2021 CLINICAL DATA:  Gastric dilatation EXAM: PORTABLE ABDOMEN - 1 VIEW COMPARISON:  Abdominal CT from 2 days ago FINDINGS: Normal bowel gas pattern. Enteric contrast is within nondilated colon. No gastric distension today. Lap band in unremarkable position. No concerning mass effect or gas collection. Right hip arthroplasty. IMPRESSION: Normal bowel gas pattern.  Resolved gastric distension. Electronically Signed   By: Jorje Guild M.D.   On: 08/25/2021 07:48    Anti-infectives: Anti-infectives (From admission, onward)    None       Assessment/Plan:  60 year old female history of laparoscopic gastric banding, recent hip replacement likely with gastric ileus.  -per KUB and abd exam gastric distention resolved.  Will order FLD tray. -mobilize when able    LOS: 1 day    Ralene Ok 08/25/2021

## 2021-08-25 NOTE — Progress Notes (Addendum)
Progress Note:    Felicia Hayden    GYJ:[id number] DOB: 24-Jun-1961 DOA: 08/23/2021  PCP: Felicia Jordan, MD    Brief Narrative:   60 year old female with a past medical history of insulin-dependent diabetes mellitus, OSA, laparoscopic gastric banding in 2005, recent right total hip arthroplasty on 08/14/2021.  The patient presented to the emergency department due to epigastric abdominal pain, nausea and vomiting.  Stated her hip pain was well controlled and she was ambulating at home without any difficulty.  States that her hip pain is well controlled and she was ambulating without difficulty.  States that her hip pain is well controlled and she is ambulating without difficulty.     Subjective:   No acute events overnight.  Patient seen and examined at bedside this morning.  Advance to full liquid diet by general surgery.  No bowel movement.  Passing flatus as of last night.  KUB obtained this morning shows improvement with no distention.  She also is complaining of dyspepsia.  She would like to be evaluated by gastroenterology given possible gastric outlet obstruction findings on CT imaging.  No fevers or chills.  DKA has resolved.    Assessment and Plan:   Intractable abdominal pain, nausea, vomiting likely secondary to gastric ileus: General surgery consulted and management per general surgery.  Advance to full liquid diet.  Continue supportive care.  Repeat KUB shows gastric distention resolved.  Mobilize when able.  Dyspepsia: CT imaging showed concerns for possible gastric outlet obstruction. Patient wanted GI consulted. I discussed with Dr. Watt Hayden, and he recommended outpatient follow-up since patient is doing better.  Euglycemic DKA: Resolved.  Discontinue insulin drip.  Transition to basal bolus insulin.  Likely secondary to SGLT2i inhibitor. DC SGLT2i upon discharge.  Long-acting 10 units now. Resume home Felicia Hayden 20 units daily with breakfast.  Hold home NovoLog  and semaglutide. Start SSI. Okay to transfer down from stepdown unit to MedSurg bed.  Ketonemia: Improved likely secondary to euglycemic DKA but states that she is on a 16-hour fast every day and eats only within an 8-hour window every day.  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  Repeat beta hydroxybutyrate in the morning.  Hypokalemia: Serum potassium 2.8.  Replete.  Repeat BMP AM.  OSA: Resume CPAP now that nausea and vomiting has resolved  Recent right total hip arthroplasty: PT/OT  Lung nodules: Tiny subpleural nodules noted bilaterally on CTA in ED.  Outpatient follow-up recommended.    Other information:    DVT prophylaxis: Lovenox Code Status: Full code Family Communication: None present Disposition:   Status is: Inpatient  Remains inpatient appropriate because:Ongoing diagnostic testing needed not appropriate for outpatient work up  Dispo: The patient is from: Home              Anticipated d/c is to: Home              Patient currently is not medically stable to d/c.   Difficult to place patient No           Consultants:   General surgery, diabetes coordinator    Objective:    Vitals:   08/25/21 0900 08/25/21 1000 08/25/21 1027 08/25/21 1200  BP: (!) 137/54 (!) 165/68 (!) 148/54 (!) 149/51  Pulse: 84 92 87 92  Resp: 13 (!) 23 19 16   Temp: 99 F (37.2 C)   98.2 F (36.8 C)  TempSrc: Oral   Axillary  SpO2: 100% 100% 100% 100%  Weight:  Height:        Intake/Output Summary (Last 24 hours) at 08/25/2021 1256 Last data filed at 08/25/2021 1200 Gross per 24 hour  Intake 2992.49 ml  Output 1025 ml  Net 1967.49 ml    Filed Weights   08/23/21 1123  Weight: 91.2 kg       Physical Exam:    General exam: Appears calm and comfortable  Respiratory system: Clear to auscultation. Respiratory effort normal. Cardiovascular system: S1 & S2 heard, RRR. No JVD, murmurs, rubs, gallops or clicks. No pedal edema. Gastrointestinal system:  Soft, nontender, obese abdomen with bowel sounds present Central nervous system: Alert and oriented. No focal neurological deficits. Extremities: Symmetric 5 x 5 power. Skin: No rashes, lesions or ulcers Psychiatry: Judgement and insight appear normal. Mood & affect appropriate.     Data Reviewed:    I have personally reviewed following labs and imaging studies  CBC: Recent Labs  Lab 08/23/21 1132 08/24/21 0521 08/25/21 0448  WBC 13.2* 14.6* 7.6  HGB 12.5 10.4* 9.7*  HCT 40.9 34.5* 30.4*  MCV 87.0 88.9 84.0  PLT 497* 382 303     Basic Metabolic Panel: Recent Labs  Lab 08/24/21 0521 08/24/21 1331 08/24/21 1642 08/24/21 2027 08/25/21 0002 08/25/21 0448  NA 137 139 140 141 142 141  K 3.5 3.3* 3.0* 2.8* 2.6* 2.8*  CL 114* 116* 116* 117* 118* 116*  CO2 8* 8* 10* 11* 14* 17*  GLUCOSE 174* 150* 149* 144* 144* 158*  BUN 11 11 11 10 10 9   CREATININE 0.82 0.77 0.83 0.75 0.60 0.59  CALCIUM 8.6* 8.8* 8.9 9.1 9.0 9.0  MG 2.2  --   --   --   --  2.4     GFR: Estimated Creatinine Clearance: 84.1 mL/min (by C-G formula based on SCr of 0.59 mg/dL).  Liver Function Tests: Recent Labs  Lab 08/23/21 1132  AST 20  ALT 18  ALKPHOS 58  BILITOT 0.6  PROT 7.8  ALBUMIN 4.7     CBG: Recent Labs  Lab 08/25/21 0429 08/25/21 0530 08/25/21 0625 08/25/21 0932 08/25/21 1207  GLUCAP 157* 148* 158* 143* 251*      Recent Results (from the past 240 hour(s))  Culture, blood (single) w Reflex to ID Panel     Status: None (Preliminary result)   Collection Time: 08/23/21  3:13 PM   Specimen: BLOOD  Result Value Ref Range Status   Specimen Description   Final    BLOOD BLOOD LEFT FOREARM Performed at [facility], [address], [place], [place] [address]    Special Requests   Final    BOTTLES DRAWN AEROBIC AND ANAEROBIC Blood Culture adequate volume Performed at [facility], [address] [place], [place] [address]    Culture    Final    NO GROWTH 2 DAYS Performed at [facility], [place] [address] [place],  [address]    Report Status PENDING  Incomplete  Resp Panel by RT-PCR (Flu A&B, Covid) Nasopharyngeal Swab     Status: None   Collection Time: 08/23/21  5:25 PM   Specimen: Nasopharyngeal Swab; Nasopharyngeal(NP) swabs in vial transport medium  Result Value Ref Range Status   SARS Coronavirus 2 by RT PCR NEGATIVE NEGATIVE Final    Comment: (NOTE) SARS-CoV-2 target nucleic acids are NOT DETECTED.  The SARS-CoV-2 RNA is generally detectable in upper respiratory specimens during the acute phase of infection. The lowest concentration of SARS-CoV-2 viral copies this assay can  detect is 138 copies/mL. A negative result does not preclude SARS-Cov-2 infection and should not be used as the sole basis for treatment or other patient management decisions. A negative result may occur with  improper specimen collection/handling, submission of specimen other than nasopharyngeal swab, presence of viral mutation(s) within the areas targeted by this assay, and inadequate number of viral copies(<138 copies/mL). A negative result must be combined with clinical observations, patient history, and epidemiological information. The expected result is Negative.  Fact Sheet for Patients:  EntrepreneurPulse.com.au  Fact Sheet for Healthcare Providers:  IncredibleEmployment.be  This test is no t yet approved or cleared by the [place] FDA and  has been authorized for detection and/or diagnosis of SARS-CoV-2 by FDA under an Emergency Use Authorization (EUA). This EUA will remain  in effect (meaning this test can be used) for the duration of the COVID-19 declaration under Section 564(b)(1) of the Act, 21 U.S.C.section 360bbb-3(b)(1), unless the authorization is terminated  or revoked sooner.       Influenza A by PCR NEGATIVE NEGATIVE Final   Influenza B by PCR NEGATIVE  NEGATIVE Final    Comment: (NOTE) The Xpert Xpress SARS-CoV-2/FLU/RSV plus assay is intended as an aid in the diagnosis of influenza from Nasopharyngeal swab specimens and should not be used as a sole basis for treatment. Nasal washings and aspirates are unacceptable for Xpert Xpress SARS-CoV-2/FLU/RSV testing.  Fact Sheet for Patients: EntrepreneurPulse.com.au  Fact Sheet for Healthcare Providers: IncredibleEmployment.be  This test is not yet approved or cleared by the [place] FDA and has been authorized for detection and/or diagnosis of SARS-CoV-2 by FDA under an Emergency Use Authorization (EUA). This EUA will remain in effect (meaning this test can be used) for the duration of the COVID-19 declaration under Section 564(b)(1) of the Act, 21 U.S.C. section 360bbb-3(b)(1), unless the authorization is terminated or revoked.  Performed at [facility], [address] [place], [place] [address]   MRSA Next Gen by PCR, Nasal     Status: None   Collection Time: 08/24/21  1:24 PM   Specimen: Nasal Mucosa; Nasal Swab  Result Value Ref Range Status   MRSA by PCR Next Gen NOT DETECTED NOT DETECTED Final    Comment: (NOTE) The GeneXpert MRSA Assay (FDA approved for NASAL specimens only), is one component of a comprehensive MRSA colonization surveillance program. It is not intended to diagnose MRSA infection nor to guide or monitor treatment for MRSA infections. Test performance is not FDA approved in patients less than 36 years old. Performed at [facility], [place] [address] [place], [place] [address]           Radiology Studies:    CT Angio Chest PE W and/or Wo Contrast  Result Date: 08/23/2021 CLINICAL DATA:  Constipation, epigastric pain since yesterday, vomiting today, nausea, diabetes mellitus, hyperlipidemia, former smoker EXAM: CT ANGIOGRAPHY CHEST WITH CONTRAST TECHNIQUE: Multidetector  CT imaging of the chest was performed using the standard protocol during bolus administration of intravenous contrast. Multiplanar CT image reconstructions and MIPs were obtained to evaluate the vascular anatomy. CONTRAST:  173mL OMNIPAQUE IOHEXOL 350 MG/ML SOLN IV COMPARISON:  None FINDINGS: Cardiovascular: Aorta normal caliber with atherosclerotic calcifications. No aortic aneurysm or dissection. Heart unremarkable. No pericardial effusion. Pulmonary arteries adequately opacified and patent. No evidence of pulmonary embolism. Mediastinum/Nodes: Base of cervical region normal appearance. Esophagus unremarkable. Lap band near GE junction. No thoracic adenopathy Lungs/Pleura: Subsegmental atelectasis RIGHT middle lobe medially. Lungs otherwise clear. No pulmonary  infiltrate, pleural effusion, or pneumothorax. Questionable 2 mm subpleural nodule LEFT lower lobe image 84. Additional questionable subpleural nodule RIGHT upper lobe 3 mm image 41. Upper Abdomen: Distended stomach below laparoscopic gastric band. Low-attenuation foci within liver question small cysts Musculoskeletal: No acute osseous findings. Review of the MIP images confirms the above findings. IMPRESSION: No evidence of pulmonary embolism. Subsegmental atelectasis RIGHT middle lobe medially. Questionable tiny subpleural nodules in both lungs; no follow-up needed if patient is low-risk (and has no known or suspected primary neoplasm). Non-contrast chest CT can be considered in 12 months if patient is high-risk. This recommendation follows the consensus statement: Guidelines for Management of Incidental Pulmonary Nodules Detected on CT Images: From the Fleischner Society 2017; Radiology 2017; 284:228-243. Electronically Signed   By: Lavonia Dana M.D.   On: 08/23/2021 16:27   US SOFT TISSUE HEAD & NECK (NON-THYROID)  Result Date: 08/25/2021 CLINICAL DATA:  Bilateral submandibular pain for 2 days EXAM: ULTRASOUND OF HEAD/NECK SOFT TISSUES TECHNIQUE:  Ultrasound examination of the head and neck soft tissues was performed in the area of clinical concern. COMPARISON:  None. FINDINGS: No discrete sonographic abnormality identified on targeted evaluation of the area of concern in the submandibular regions bilaterally. IMPRESSION: Unremarkable sonographic evaluation of the area of patient's pain in the submandibular region. Electronically Signed   By: Miachel Roux M.D.   On: 08/25/2021 10:27   CT ABDOMEN PELVIS W CONTRAST  Result Date: 08/23/2021 CLINICAL DATA:  Epigastric pain, nausea, vomiting, prior laparoscopic gastric banding. Recent hip replacement surgery 08/14/2021 EXAM: CT ABDOMEN AND PELVIS WITH CONTRAST TECHNIQUE: Multidetector CT imaging of the abdomen and pelvis was performed using the standard protocol following bolus administration of intravenous contrast. Sagittal and coronal MPR images reconstructed from axial data set. CONTRAST:  168mL OMNIPAQUE IOHEXOL 350 MG/ML SOLN IV. Dilute oral contrast. COMPARISON:  None FINDINGS: Lower chest: Questionable tiny subpleural nodule LEFT lower lobe see CT chest report. Remaining lung bases clear. Hepatobiliary: Small lesions within liver, some representing cysts, additional of which are indeterminate. Unremarkable gallbladder. No biliary dilatation. Pancreas: Atrophic pancreas without mass Spleen: Normal appearance.  Small splenule inferior to spleen. Adrenals/Urinary Tract: Adrenal glands, kidneys, ureters, and bladder normal appearance Stomach/Bowel: Normal appendix. Prior laparoscopic gastric banding. Question partial small bowel malrotation with majority of small bowel loops in RIGHT upper quadrant. Stomach distal to gastric band is distended by contrast, though contrast has passed into small bowel loops. Decompressed duodenal bulb and descending duodenum. Component of pyloric stenosis or gastric outlet obstruction not completely excluded. Vascular/Lymphatic: Scattered pelvic phleboliths. Atherosclerotic  calcifications aorta and iliac arteries without aneurysm. Vascular structures patent. No adenopathy. Reproductive: Multiple uterine nodules likely representing leiomyomata. LEFT lateral leiomyoma posteriorly 2.7 x 3.1 x 3.6 cm. RIGHT superior fundal leiomyoma 2.6 x 2.3 x 2.6 cm. Small nodules less than 1 cm. Additional mass superior to the uterine fundus, favor exophytic leiomyoma 4.3 x 3.7 x 4.0 cm. Atrophic ovaries. Other: No free air or free fluid.  No hernia. Musculoskeletal: RIGHT hip prosthesis. Overlying soft tissue swelling and tissue infiltration consistent with recent surgery. Multiple foci of soft tissue gas and a small amount of fluid are identified both within and superficial to the muscles, may represent resolving postsurgical changes but infection not excluded. No other osseous abnormalities. IMPRESSION: Prior laparoscopic gastric banding with stomach distal to gastric band distended by contrast, though contrast has passed into small bowel loops; component of pyloric stenosis or gastric outlet obstruction not completely excluded. Question partial small bowel malrotation  with majority of small bowel loops in RIGHT upper quadrant. Multiple uterine leiomyomata. Multiple foci of soft tissue gas and a small amount of fluid are identified both within subcutaneous soft tissues and within muscles anterior to the RIGHT hip prosthesis may represent resolving postsurgical changes but infection not excluded by CT. Aortic Atherosclerosis (ICD10-I70.0). Electronically Signed   By: Lavonia Dana M.D.   On: 08/23/2021 16:42   DG Abd Portable 1V  Result Date: 08/25/2021 CLINICAL DATA:  Gastric dilatation EXAM: PORTABLE ABDOMEN - 1 VIEW COMPARISON:  Abdominal CT from 2 days ago FINDINGS: Normal bowel gas pattern. Enteric contrast is within nondilated colon. No gastric distension today. Lap band in unremarkable position. No concerning mass effect or gas collection. Right hip arthroplasty. IMPRESSION: Normal bowel gas  pattern.  Resolved gastric distension. Electronically Signed   By: Jorje Guild M.D.   On: 08/25/2021 07:48        Medications:    Scheduled Meds:  Chlorhexidine Gluconate Cloth  6 each Topical Daily   insulin aspart  0-15 Units Subcutaneous TID WC   insulin aspart  0-5 Units Subcutaneous QHS   insulin detemir  10 Units Subcutaneous BID   potassium chloride  40 mEq Oral BID   Continuous Infusions:  sodium chloride Stopped (08/25/21 0727)   dextrose 5% lactated ringers Stopped (08/25/21 0728)   insulin Stopped (08/25/21 0734)   lactated ringers         LOS: 1 day    Time spent: 35-minutes    Leslee Home, MD [facility]   To contact the attending provider between 7A-7P or the covering provider during after hours 7P-7A, please log into the web site [email or web address] and access using universal [place] password for that web site. If you do not have the password, please call the hospital operator.  08/25/2021, 12:56 PM

## 2021-08-26 DIAGNOSIS — E119 Type 2 diabetes mellitus without complications: Secondary | ICD-10-CM | POA: Diagnosis not present

## 2021-08-26 DIAGNOSIS — K3189 Other diseases of stomach and duodenum: Secondary | ICD-10-CM

## 2021-08-26 DIAGNOSIS — R109 Unspecified abdominal pain: Secondary | ICD-10-CM | POA: Diagnosis not present

## 2021-08-26 DIAGNOSIS — K567 Ileus, unspecified: Principal | ICD-10-CM

## 2021-08-26 LAB — BASIC METABOLIC PANEL
Anion gap: 7 (ref 5–15)
BUN: 8 mg/dL (ref 8–23)
CO2: 26 mmol/L (ref 22–32)
Calcium: 9 mg/dL (ref 8.9–10.3)
Chloride: 114 mmol/L — ABNORMAL HIGH (ref 98–111)
Creatinine, Ser: 0.53 mg/dL (ref 0.44–1.00)
GFR, Estimated: >60 mL/min (ref >60)
Glucose, Bld: 144 mg/dL — ABNORMAL HIGH (ref 70–99)
Potassium: 3.6 mmol/L (ref 3.5–5.1)
Sodium: 147 mmol/L — ABNORMAL HIGH (ref 135–145)

## 2021-08-26 LAB — CBC
HCT: 31.9 % — ABNORMAL LOW (ref 36.0–46.0)
Hemoglobin: 10.3 g/dL — ABNORMAL LOW (ref 12.0–15.0)
MCH: 27.1 pg (ref 26.0–34.0)
MCHC: 32.3 g/dL (ref 30.0–36.0)
MCV: 83.9 fL (ref 80.0–100.0)
Platelets: 287 10*3/uL (ref 150–400)
RBC: 3.8 MIL/uL — ABNORMAL LOW (ref 3.87–5.11)
RDW: 15.3 % (ref 11.5–15.5)
WBC: 4.7 10*3/uL (ref 4.0–10.5)
nRBC: 0 % (ref 0.0–0.2)

## 2021-08-26 LAB — GLUCOSE, CAPILLARY
Glucose-Capillary: 136 mg/dL — ABNORMAL HIGH (ref 70–99)
Glucose-Capillary: 155 mg/dL — ABNORMAL HIGH (ref 70–99)
Glucose-Capillary: 97 mg/dL (ref 70–99)
Glucose-Capillary: 250 mg/dL — ABNORMAL HIGH (ref 70–99)

## 2021-08-26 LAB — BETA-HYDROXYBUTYRIC ACID: Beta-Hydroxybutyric Acid: 1.3 mmol/L — ABNORMAL HIGH (ref 0.05–0.27)

## 2021-08-26 MED ORDER — PANTOPRAZOLE SODIUM 40 MG IV SOLR
40.0000 mg | INTRAVENOUS | Status: DC
  Administered 2021-08-26 – 2021-08-27 (×2): 40 mg via INTRAVENOUS
  Filled 2021-08-26 (×2): qty 40

## 2021-08-26 MED ORDER — PANTOPRAZOLE SODIUM 40 MG IV SOLR: 40.0000 mg | Freq: Every day | INTRAVENOUS | Status: DC

## 2021-08-26 MED ORDER — BISACODYL 10 MG RE SUPP
10.0000 mg | Freq: Once | RECTAL | Status: DC
  Filled 2021-08-26: qty 1

## 2021-08-26 MED ORDER — POTASSIUM CHLORIDE 20 MEQ PO PACK
40.0000 meq | PACK | Freq: Two times a day (BID) | ORAL | Status: DC
  Administered 2021-08-26: 40 meq via ORAL
  Filled 2021-08-26: qty 2

## 2021-08-26 MED ORDER — PANTOPRAZOLE SODIUM 40 MG IV SOLR
40.0000 mg | INTRAVENOUS | Status: DC
  Filled 2021-08-26: qty 40

## 2021-08-26 MED ORDER — FAMOTIDINE IN NACL 20-0.9 MG/50ML-% IV SOLN
20.0000 mg | Freq: Two times a day (BID) | INTRAVENOUS | Status: DC
  Administered 2021-08-26 – 2021-08-27 (×4): 20 mg via INTRAVENOUS
  Filled 2021-08-26 (×4): qty 50

## 2021-08-26 MED ORDER — METOCLOPRAMIDE HCL 5 MG/ML IJ SOLN
5.0000 mg | Freq: Four times a day (QID) | INTRAMUSCULAR | Status: DC
  Administered 2021-08-26 – 2021-08-28 (×8): 5 mg via INTRAVENOUS
  Filled 2021-08-26 (×8): qty 2

## 2021-08-26 NOTE — Progress Notes (Deleted)
Pt IV was taken out, d/c instruction given pt wanted to walk and refused wheelchair. RN accompanied pt to main entrance.

## 2021-08-26 NOTE — Plan of Care (Signed)
  Problem: Health Behavior/Discharge Planning: Goal: Ability to manage health-related needs will improve Outcome: Progressing   Problem: Education: Goal: Knowledge of General Education information will improve Description: Including pain rating scale, medication(s)/side effects and non-pharmacologic comfort measures Outcome: Progressing   Problem: Health Behavior/Discharge Planning: Goal: Ability to manage health-related needs will improve Outcome: Progressing   Problem: Clinical Measurements: Goal: Will remain free from infection Outcome: Progressing

## 2021-08-26 NOTE — Progress Notes (Signed)
Progress Note:    Felicia Hayden    NUU:[id number] DOB: 08-18-1961 DOA: 08/23/2021  PCP: Jonathon Jordan, MD    Brief Narrative:   60 year old female with a past medical history of insulin-dependent diabetes mellitus, OSA, laparoscopic gastric banding in 2005, recent right total hip arthroplasty on 08/14/2021.  The patient presented to the emergency department due to epigastric abdominal pain, nausea and vomiting.  Stated her hip pain was well controlled and she was ambulating at home without any difficulty.  States that her hip pain is well controlled and she was ambulating without difficulty.  States that her hip pain is well controlled and she is ambulating without difficulty.     Subjective:   Patient mentions that she has been feeling nauseated and has had a few episodes of vomiting overnight.  Some upper abdominal discomfort is present occasionally but none currently.  Denies any chest pain or shortness of breath.      Assessment and Plan:   Gastric ileus Presented with abdominal pain nausea vomiting.  General surgery is following.  Was feeling better yesterday but had recurrence of her symptoms overnight.  Seen by general surgery this morning and started on PPI.  She remains on liquid diet.  Case was also discussed with gastroenterology yesterday who recommended continuing current management. Abdominal film was repeated on 10/11 which showed resolution of the gastric distention.  Euglycemic DKA/insulin-dependent diabetes mellitus Appears to have resolved.  Transitioned back to subcutaneous insulin.  Likely triggered by Vania Rea which has been discontinued.  CBGs.  Continue SSI.  HbA1c 6.2.  Normocytic anemia Drop in hemoglobin is dilutional.  No evidence of overt bleeding.  Hypokalemia/hypernatremia Repleted.  Obstructive sleep apnea Continue CPAP.  Recent right total hip arthroplasty PT and OT evaluation.  Surgery was performed by Dr. Lyla Glassing.  No DVT  noted on Doppler studies.  Pulmonary nodules Tiny subpleural nodules noted bilaterally on CT angiogram.  Will need outpatient follow-up.     Other information:    DVT prophylaxis: Lovenox Code Status: Full code Family Communication: None present Disposition: Hopefully return home in improved  Status is: Inpatient  Remains inpatient appropriate because:Ongoing diagnostic testing needed not appropriate for outpatient work up  Dispo: The patient is from: Home              Anticipated d/c is to: Home              Patient currently is not medically stable to d/c.   Difficult to place patient No    Consultants:   General surgery    Objective:    Vitals:   08/25/21 2041 08/26/21 0012 08/26/21 0425 08/26/21 0853  BP: (!) 151/78 125/74 (!) 127/53 (!) 128/58  Pulse: 91 94  75  Resp: 16 16 20 20   Temp: 98.7 F (37.1 C) 98.3 F (36.8 C) 98.9 F (37.2 C) 98.3 F (36.8 C)  TempSrc: Oral Oral Oral Oral  SpO2: 100% 100%  100%  Weight:      Height:        Intake/Output Summary (Last 24 hours) at 08/26/2021 1115 Last data filed at 08/26/2021 3474 Gross per 24 hour  Intake 38.63 ml  Output 1050 ml  Net -1011.37 ml    Filed Weights   08/23/21 1123  Weight: 91.2 kg       Physical Exam:    General appearance: Awake alert.  In no distress Resp: Clear to auscultation bilaterally.  Normal effort Cardio: S1-S2 is normal regular.  No S3-S4.  No rubs murmurs or bruit GI: Abdomen is soft.  Mildly tender in the epigastric area without any rebound rigidity or guarding.  No masses organomegaly.  Bowel sounds present normal.   Extremities: No edema.  Full range of motion of lower extremities. Neurologic: Alert and oriented x3.  No focal neurological deficits.      Data Reviewed:    I have personally reviewed following labs and imaging studies  CBC: Recent Labs  Lab 08/23/21 1132 08/24/21 0521 08/25/21 0448 08/26/21 0405  WBC 13.2* 14.6* 7.6 4.7  HGB 12.5  10.4* 9.7* 10.3*  HCT 40.9 34.5* 30.4* 31.9*  MCV 87.0 88.9 84.0 83.9  PLT 497* 382 303 287     Basic Metabolic Panel: Recent Labs  Lab 08/24/21 0521 08/24/21 1331 08/24/21 2027 08/25/21 0002 08/25/21 0448 08/25/21 1224 08/26/21 0405  NA 137   < > 141 142 141 140 147*  K 3.5   < > 2.8* 2.6* 2.8* 2.5* 3.6  CL 114*   < > 117* 118* 116* 112* 114*  CO2 8*   < > 11* 14* 17* 20* 26  GLUCOSE 174*   < > 144* 144* 158* 278* 144*  BUN 11   < > 10 10 9 9 8   CREATININE 0.82   < > 0.75 0.60 0.59 0.61 0.53  CALCIUM 8.6*   < > 9.1 9.0 9.0 8.7* 9.0  MG 2.2  --   --   --  2.4  --   --    < > = values in this interval not displayed.     GFR: Estimated Creatinine Clearance: 84.1 mL/min (by C-G formula based on SCr of 0.53 mg/dL).  Liver Function Tests: Recent Labs  Lab 08/23/21 1132  AST 20  ALT 18  ALKPHOS 58  BILITOT 0.6  PROT 7.8  ALBUMIN 4.7     CBG: Recent Labs  Lab 08/25/21 0932 08/25/21 1207 08/25/21 1713 08/25/21 2107 08/26/21 0731  GLUCAP 143* 251* 132* 94 136*      Recent Results (from the past 240 hour(s))  Culture, blood (single) w Reflex to ID Panel     Status: None (Preliminary result)   Collection Time: 08/23/21  3:13 PM   Specimen: BLOOD  Result Value Ref Range Status   Specimen Description   Final    BLOOD BLOOD LEFT FOREARM Performed at [facility], [address], [place], [place] [address]    Special Requests   Final    BOTTLES DRAWN AEROBIC AND ANAEROBIC Blood Culture adequate volume Performed at [facility], [address] [place], [place] [address]    Culture   Final    NO GROWTH 3 DAYS Performed at [facility], [place] [address], [place],  [address]    Report Status PENDING  Incomplete  Resp Panel by RT-PCR (Flu A&B, Covid) Nasopharyngeal Swab     Status: None   Collection Time: 08/23/21  5:25 PM   Specimen: Nasopharyngeal Swab; Nasopharyngeal(NP) swabs in vial transport medium   Result Value Ref Range Status   SARS Coronavirus 2 by RT PCR NEGATIVE NEGATIVE Final    Comment: (NOTE) SARS-CoV-2 target nucleic acids are NOT DETECTED.  The SARS-CoV-2 RNA is generally detectable in upper respiratory specimens during the acute phase of infection. The lowest concentration of SARS-CoV-2 viral copies this assay can detect is 138 copies/mL. A negative result does not preclude SARS-Cov-2 infection and should not be used as the sole basis for treatment  or other patient management decisions. A negative result may occur with  improper specimen collection/handling, submission of specimen other than nasopharyngeal swab, presence of viral mutation(s) within the areas targeted by this assay, and inadequate number of viral copies(<138 copies/mL). A negative result must be combined with clinical observations, patient history, and epidemiological information. The expected result is Negative.  Fact Sheet for Patients:  EntrepreneurPulse.com.au  Fact Sheet for Healthcare Providers:  IncredibleEmployment.be  This test is no t yet approved or cleared by the [place] FDA and  has been authorized for detection and/or diagnosis of SARS-CoV-2 by FDA under an Emergency Use Authorization (EUA). This EUA will remain  in effect (meaning this test can be used) for the duration of the COVID-19 declaration under Section 564(b)(1) of the Act, 21 U.S.C.section 360bbb-3(b)(1), unless the authorization is terminated  or revoked sooner.       Influenza A by PCR NEGATIVE NEGATIVE Final   Influenza B by PCR NEGATIVE NEGATIVE Final    Comment: (NOTE) The Xpert Xpress SARS-CoV-2/FLU/RSV plus assay is intended as an aid in the diagnosis of influenza from Nasopharyngeal swab specimens and should not be used as a sole basis for treatment. Nasal washings and aspirates are unacceptable for Xpert Xpress SARS-CoV-2/FLU/RSV testing.  Fact Sheet for  Patients: EntrepreneurPulse.com.au  Fact Sheet for Healthcare Providers: IncredibleEmployment.be  This test is not yet approved or cleared by the [place] FDA and has been authorized for detection and/or diagnosis of SARS-CoV-2 by FDA under an Emergency Use Authorization (EUA). This EUA will remain in effect (meaning this test can be used) for the duration of the COVID-19 declaration under Section 564(b)(1) of the Act, 21 U.S.C. section 360bbb-3(b)(1), unless the authorization is terminated or revoked.  Performed at [facility], [address], [place], [place] [address]   MRSA Next Gen by PCR, Nasal     Status: None   Collection Time: 08/24/21  1:24 PM   Specimen: Nasal Mucosa; Nasal Swab  Result Value Ref Range Status   MRSA by PCR Next Gen NOT DETECTED NOT DETECTED Final    Comment: (NOTE) The GeneXpert MRSA Assay (FDA approved for NASAL specimens only), is one component of a comprehensive MRSA colonization surveillance program. It is not intended to diagnose MRSA infection nor to guide or monitor treatment for MRSA infections. Test performance is not FDA approved in patients less than 60 years old. Performed at [facility], [place] [address] [place], [place] [address]           Radiology Studies:    US SOFT TISSUE HEAD & NECK (NON-THYROID)  Result Date: 08/25/2021 CLINICAL DATA:  Bilateral submandibular pain for 2 days EXAM: ULTRASOUND OF HEAD/NECK SOFT TISSUES TECHNIQUE: Ultrasound examination of the head and neck soft tissues was performed in the area of clinical concern. COMPARISON:  None. FINDINGS: No discrete sonographic abnormality identified on targeted evaluation of the area of concern in the submandibular regions bilaterally. IMPRESSION: Unremarkable sonographic evaluation of the area of patient's pain in the submandibular region. Electronically Signed   By: Miachel Roux M.D.   On:  08/25/2021 10:27   DG Abd Portable 1V  Result Date: 08/25/2021 CLINICAL DATA:  Gastric dilatation EXAM: PORTABLE ABDOMEN - 1 VIEW COMPARISON:  Abdominal CT from 2 days ago FINDINGS: Normal bowel gas pattern. Enteric contrast is within nondilated colon. No gastric distension today. Lap band in unremarkable position. No concerning mass effect or gas collection. Right hip arthroplasty. IMPRESSION: Normal bowel gas pattern.  Resolved gastric distension. Electronically Signed   By: Jorje Guild M.D.   On: 08/25/2021 07:48   VAS [place] LOWER EXTREMITY VENOUS (DVT)  Result Date: 08/25/2021  Lower Venous DVT Study Patient Name:  CHERILYN SAUTTER  Date of Exam:   08/25/2021 Medical Rec #: [id number]                Accession #:    [id number] Date of Birth: 11/28/1960                 Patient Gender: F Patient Age:   59 years Exam Location:  [facility] Procedure:      VAS [place] LOWER EXTREMITY VENOUS (DVT) Referring Phys: Imagene Sheller --------------------------------------------------------------------------------  Indications: Pain, and Swelling.  Risk Factors: Surgery. Comparison Study: No prior studies. Performing Technologist: Oliver Hum RVT  Examination Guidelines: A complete evaluation includes B-mode imaging, spectral Doppler, color Doppler, and power Doppler as needed of all accessible portions of each vessel. Bilateral testing is considered an integral part of a complete examination. Limited examinations for reoccurring indications may be performed as noted. The reflux portion of the exam is performed with the patient in reverse Trendelenburg.  +---------+---------------+---------+-----------+----------+--------------+ RIGHT    CompressibilityPhasicitySpontaneityPropertiesThrombus Aging +---------+---------------+---------+-----------+----------+--------------+ CFV      Full           Yes      Yes                                  +---------+---------------+---------+-----------+----------+--------------+ SFJ      Full                                                        +---------+---------------+---------+-----------+----------+--------------+ FV Prox  Full                                                        +---------+---------------+---------+-----------+----------+--------------+ FV Mid   Full                                                        +---------+---------------+---------+-----------+----------+--------------+ FV DistalFull                                                        +---------+---------------+---------+-----------+----------+--------------+ PFV      Full                                                        +---------+---------------+---------+-----------+----------+--------------+ POP      Full           Yes      Yes                                 +---------+---------------+---------+-----------+----------+--------------+  PTV      Full                                                        +---------+---------------+---------+-----------+----------+--------------+ PERO     Full                                                        +---------+---------------+---------+-----------+----------+--------------+   +----+---------------+---------+-----------+----------+--------------+ LEFTCompressibilityPhasicitySpontaneityPropertiesThrombus Aging +----+---------------+---------+-----------+----------+--------------+ CFV Full           Yes      Yes                                 +----+---------------+---------+-----------+----------+--------------+    Summary: RIGHT: - There is no evidence of deep vein thrombosis in the lower extremity.  - No cystic structure found in the popliteal fossa.  LEFT: - No evidence of common femoral vein obstruction.  *See table(s) above for measurements and observations.    Preliminary         Medications:     Scheduled Meds:  bisacodyl  10 mg Rectal Once   enoxaparin (LOVENOX) injection  40 mg Subcutaneous Daily   insulin aspart  0-20 Units Subcutaneous TID WC   insulin aspart  0-5 Units Subcutaneous QHS   insulin glargine-yfgn  20 Units Subcutaneous QAC breakfast   pantoprazole (PROTONIX) IV  40 mg Intravenous Q24H   potassium chloride  40 mEq Oral BID   Continuous Infusions:  sodium chloride Stopped (08/25/21 2014)   famotidine (PEPCID) IV 20 mg (08/26/21 1027)       LOS: 2 days   Bonnielee Haff, MD [facility]  08/26/2021, 11:15 AM

## 2021-08-26 NOTE — Evaluation (Signed)
Occupational Therapy Evaluation Patient Details Name: Felicia Hayden MRN: [id number] DOB: 01-03-1961 Today's Date: 08/26/2021   History of Present Illness 60 y.o. female admitted for abdominal pain with emesis. CT scan revealed dilated stomach & duodenum with no masses. PMH insulin dependent DM, heart murmur, sleep apnea, R THA 9/30.   Clinical Impression   PTA, pt was living with husband, working from home, and independent in ADLs; exception of LB dressing (husband assisted due to recent R THA). Upon evaluation, pt with nausea throughout session, but functionally back to baseline. Pt currently requiring supervision for ADLs in standing and functional mobility to bathroom to wash hands at sink. Due to current level of function and caregiver support, recommending no OT follow up and intermittent supervision. Has no acute needs.    Recommendations for follow up therapy are one component of a multi-disciplinary discharge planning process, led by the attending physician.  Recommendations may be updated based on patient status, additional functional criteria and insurance authorization.   Follow Up Recommendations  No OT follow up;Supervision - Intermittent    Equipment Recommendations  None recommended by OT    Recommendations for Other Services       Precautions / Restrictions Precautions Precaution Comments: Nausea Restrictions Weight Bearing Restrictions: No      Mobility Bed Mobility Overal bed mobility: Modified Independent             General bed mobility comments: HOB elevated    Transfers Overall transfer level: Needs assistance   Transfers: Sit to/from Stand           General transfer comment: Supervision for safety, functional mobility to bathroom with no LOBs    Balance Overall balance assessment: No apparent balance deficits (not formally assessed)                                         ADL either performed or assessed with  clinical judgement   ADL Overall ADL's : At baseline                                       General ADL Comments: Pt supervision level for all ADLs in standing. supervision for functional mobility. Required min A for LB dressing (needed at baseline) but reports her husband performs for her.     Vision Patient Visual Report: No change from baseline       Perception     Praxis      Pertinent Vitals/Pain Pain Assessment: Faces Faces Pain Scale: Hurts a little bit Pain Location: R hip Pain Descriptors / Indicators: Aching Pain Intervention(s): Monitored during session     Hand Dominance     Extremity/Trunk Assessment Upper Extremity Assessment Upper Extremity Assessment: Overall WFL for tasks assessed   Lower Extremity Assessment Lower Extremity Assessment: Defer to PT evaluation   Cervical / Trunk Assessment Cervical / Trunk Assessment: Normal   Communication Communication Communication: No difficulties   Cognition Arousal/Alertness: Awake/alert Behavior During Therapy: WFL for tasks assessed/performed Overall Cognitive Status: Within Functional Limits for tasks assessed                                 General Comments: Pt able to follow multistep commands, reported correct recent medical history  General Comments       Exercises     Shoulder Instructions      Home Living Family/patient expects to be discharged to:: Private residence Living Arrangements: Spouse/significant other Available Help at Discharge: Family;Available PRN/intermittently Type of Home: House Home Access: Stairs to enter [employer] of Steps: 5 Entrance Stairs-Rails: Can reach both Home Layout: Two level;Able to live on main level with bedroom/bathroom Alternate Level Stairs-Number of Steps: 18 Alternate Level Stairs-Rails: Left Bathroom Shower/Tub: Walk-in shower;Tub/shower unit         Home Equipment: Walker - 2 wheels           Prior Functioning/Environment Level of Independence: Independent        Comments: Recently had R hip surger. Required assistance from husband with LB dressing. Independent in ADLs/IADLs before surgery.        OT Problem List: Decreased activity tolerance      OT Treatment/Interventions:      OT Goals(Current goals can be found in the care plan section)    OT Frequency:     Barriers to D/C:            Co-evaluation              AM-PAC OT "6 Clicks" Daily Activity     Outcome Measure Help from another person eating meals?: None Help from another person taking care of personal grooming?: A Little Help from another person toileting, which includes using toliet, bedpan, or urinal?: A Little Help from another person bathing (including washing, rinsing, drying)?: A Little Help from another person to put on and taking off regular upper body clothing?: A Little Help from another person to put on and taking off regular lower body clothing?: A Little 6 Click Score: 19   End of Session Nurse Communication: Mobility status;Other (comment) (continued nausea throughout session)  Activity Tolerance: Patient tolerated treatment well Patient left: in chair;with call bell/phone within reach;with chair alarm set  OT Visit Diagnosis: Muscle weakness (generalized) (M62.81)                Time: 5427-0623 OT Time Calculation (min): 29 min Charges:  OT General Charges $OT Visit: 1 Visit OT Evaluation $OT Eval Low Complexity: 1 Low OT Treatments $Self Care/Home Management : 8-22 mins  Felicia Hayden, OTS Acute Rehab Office: [phone]   Felicia Hayden 08/26/2021, 12:29 PM

## 2021-08-26 NOTE — Progress Notes (Signed)
Patient ID: Felicia Hayden, female   DOB: 04-Jul-1961, 60 y.o.   MRN: [id number] [facility] Surgery Progress Note     Subjective: CC-  States that she initially did well with full liquids yesterday. Tolerated without pain or n/v. States that her n/v returned last night when she transferred to the floor. She has some mild epigastric pain and burning, and continues to vomit this morning. Maalox helps a little. Passing flatus, no BM.  Objective: Vital signs in last 24 hours: Temp:  [98.2 F (36.8 C)-99 F (37.2 C)] 98.9 F (37.2 C) (10/12 0425) Pulse Rate:  [76-95] 94 (10/12 0012) Resp:  [10-23] 20 (10/12 0425) BP: (100-165)/(43-78) 127/53 (10/12 0425) SpO2:  [96 %-100 %] 100 % (10/12 0012) Last BM Date: 08/22/21  Intake/Output from previous day: 10/11 0701 - 10/12 0700 In: 239.2 [I.V.:205.4; IV Piggyback:33.8] Out: 1050 [Urine:1050] Intake/Output this shift: No intake/output data recorded.  PE: Gen:  Alert, NAD, pleasant Pulm: rate and effort normal Abd: Soft, ND, +BS, mild epigastric TTP without rebound or guarding Psych: A&Ox4  Skin: no rashes noted, warm and dry  Lab Results:  Recent Labs    08/25/21 0448 08/26/21 0405  WBC 7.6 4.7  HGB 9.7* 10.3*  HCT 30.4* 31.9*  PLT 303 287   BMET Recent Labs    08/25/21 1224 08/26/21 0405  NA 140 147*  K 2.5* 3.6  CL 112* 114*  CO2 20* 26  GLUCOSE 278* 144*  BUN 9 8  CREATININE 0.61 0.53  CALCIUM 8.7* 9.0   PT/INR No results for input(s): LABPROT, INR in the last 72 hours. CMP     Component Value Date/Time   NA 147 (H) 08/26/2021 0405   K 3.6 08/26/2021 0405   CL 114 (H) 08/26/2021 0405   CO2 26 08/26/2021 0405   GLUCOSE 144 (H) 08/26/2021 0405   BUN 8 08/26/2021 0405   CREATININE 0.53 08/26/2021 0405   CALCIUM 9.0 08/26/2021 0405   PROT 7.8 08/23/2021 1132   ALBUMIN 4.7 08/23/2021 1132   AST 20 08/23/2021 1132   ALT 18 08/23/2021 1132   ALKPHOS 58 08/23/2021 1132   BILITOT 0.6 08/23/2021  1132   GFRNONAA >60 08/26/2021 0405   Lipase     Component Value Date/Time   LIPASE <10 (L) 08/23/2021 1132       Studies/Results: US SOFT TISSUE HEAD & NECK (NON-THYROID)  Result Date: 08/25/2021 CLINICAL DATA:  Bilateral submandibular pain for 2 days EXAM: ULTRASOUND OF HEAD/NECK SOFT TISSUES TECHNIQUE: Ultrasound examination of the head and neck soft tissues was performed in the area of clinical concern. COMPARISON:  None. FINDINGS: No discrete sonographic abnormality identified on targeted evaluation of the area of concern in the submandibular regions bilaterally. IMPRESSION: Unremarkable sonographic evaluation of the area of patient's pain in the submandibular region. Electronically Signed   By: Miachel Roux M.D.   On: 08/25/2021 10:27   DG Abd Portable 1V  Result Date: 08/25/2021 CLINICAL DATA:  Gastric dilatation EXAM: PORTABLE ABDOMEN - 1 VIEW COMPARISON:  Abdominal CT from 2 days ago FINDINGS: Normal bowel gas pattern. Enteric contrast is within nondilated colon. No gastric distension today. Lap band in unremarkable position. No concerning mass effect or gas collection. Right hip arthroplasty. IMPRESSION: Normal bowel gas pattern.  Resolved gastric distension. Electronically Signed   By: Jorje Guild M.D.   On: 08/25/2021 07:48   VAS [place] LOWER EXTREMITY VENOUS (DVT)  Result Date: 08/25/2021  Lower Venous DVT Study Patient Name:  Felicia  Nile Hayden  Date of Exam:   08/25/2021 Medical Rec #: [id number]                Accession #:    [id number] Date of Birth: 03/05/1961                 Patient Gender: F Patient Age:   9 years Exam Location:  [facility] Procedure:      VAS [place] LOWER EXTREMITY VENOUS (DVT) Referring Phys: Imagene Sheller --------------------------------------------------------------------------------  Indications: Pain, and Swelling.  Risk Factors: Surgery. Comparison Study: No prior studies. Performing Technologist: Oliver Hum RVT  Examination  Guidelines: A complete evaluation includes B-mode imaging, spectral Doppler, color Doppler, and power Doppler as needed of all accessible portions of each vessel. Bilateral testing is considered an integral part of a complete examination. Limited examinations for reoccurring indications may be performed as noted. The reflux portion of the exam is performed with the patient in reverse Trendelenburg.  +---------+---------------+---------+-----------+----------+--------------+ RIGHT    CompressibilityPhasicitySpontaneityPropertiesThrombus Aging +---------+---------------+---------+-----------+----------+--------------+ CFV      Full           Yes      Yes                                 +---------+---------------+---------+-----------+----------+--------------+ SFJ      Full                                                        +---------+---------------+---------+-----------+----------+--------------+ FV Prox  Full                                                        +---------+---------------+---------+-----------+----------+--------------+ FV Mid   Full                                                        +---------+---------------+---------+-----------+----------+--------------+ FV DistalFull                                                        +---------+---------------+---------+-----------+----------+--------------+ PFV      Full                                                        +---------+---------------+---------+-----------+----------+--------------+ POP      Full           Yes      Yes                                 +---------+---------------+---------+-----------+----------+--------------+ PTV  Full                                                        +---------+---------------+---------+-----------+----------+--------------+ PERO     Full                                                         +---------+---------------+---------+-----------+----------+--------------+   +----+---------------+---------+-----------+----------+--------------+ LEFTCompressibilityPhasicitySpontaneityPropertiesThrombus Aging +----+---------------+---------+-----------+----------+--------------+ CFV Full           Yes      Yes                                 +----+---------------+---------+-----------+----------+--------------+    Summary: RIGHT: - There is no evidence of deep vein thrombosis in the lower extremity.  - No cystic structure found in the popliteal fossa.  LEFT: - No evidence of common femoral vein obstruction.  *See table(s) above for measurements and observations.    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 Continue full liquids, mobilize. Replete K. Will recheck later today. If symptoms not improving with reflux medications will consider starting reglan.   ID - none FEN - IVF, FLD VTE - lovenox Foley - none  DM DKA - improved OSA   LOS: 2 days    Wellington Hampshire, [facility] Surgery 08/26/2021, 8:49 AM Please see Amion for pager number during day hours 7:00am-4:30pm

## 2021-08-26 NOTE — Progress Notes (Signed)
PT Cancellation Note  Patient Details Name: Felicia Hayden MRN: [id number] DOB: 02-26-1961   Cancelled Treatment:    Reason Eval/Treat Not Completed: Fatigue/lethargy limiting ability to participate;Patient declined, no reason specified (Pt reports feeling severe heartburn and tired. pt requests to wait on PT and attempt later today or tomorrow. Will follow up at later date/time as schedule allows.)   Gwynneth Albright PT, DPT Acute Rehabilitation Services Office [phone] Pager [phone]

## 2021-08-27 DIAGNOSIS — K567 Ileus, unspecified: Secondary | ICD-10-CM | POA: Diagnosis not present

## 2021-08-27 DIAGNOSIS — Z794 Long term (current) use of insulin: Secondary | ICD-10-CM | POA: Diagnosis not present

## 2021-08-27 DIAGNOSIS — E119 Type 2 diabetes mellitus without complications: Secondary | ICD-10-CM | POA: Diagnosis not present

## 2021-08-27 DIAGNOSIS — E876 Hypokalemia: Secondary | ICD-10-CM

## 2021-08-27 DIAGNOSIS — K3189 Other diseases of stomach and duodenum: Secondary | ICD-10-CM | POA: Diagnosis not present

## 2021-08-27 LAB — CBC
HCT: 29.7 % — ABNORMAL LOW (ref 36.0–46.0)
Hemoglobin: 9.8 g/dL — ABNORMAL LOW (ref 12.0–15.0)
MCH: 27.6 pg (ref 26.0–34.0)
MCHC: 33 g/dL (ref 30.0–36.0)
MCV: 83.7 fL (ref 80.0–100.0)
Platelets: 295 10*3/uL (ref 150–400)
RBC: 3.55 MIL/uL — ABNORMAL LOW (ref 3.87–5.11)
RDW: 15.2 % (ref 11.5–15.5)
WBC: 4.2 10*3/uL (ref 4.0–10.5)
nRBC: 0 % (ref 0.0–0.2)

## 2021-08-27 LAB — BASIC METABOLIC PANEL
Anion gap: 6 (ref 5–15)
Anion gap: 9 (ref 5–15)
BUN: 8 mg/dL (ref 8–23)
BUN: 8 mg/dL (ref 8–23)
CO2: 30 mmol/L (ref 22–32)
CO2: 27 mmol/L (ref 22–32)
Calcium: 8.7 mg/dL — ABNORMAL LOW (ref 8.9–10.3)
Calcium: 8.6 mg/dL — ABNORMAL LOW (ref 8.9–10.3)
Chloride: 114 mmol/L — ABNORMAL HIGH (ref 98–111)
Chloride: 107 mmol/L (ref 98–111)
Creatinine, Ser: 0.48 mg/dL (ref 0.44–1.00)
Creatinine, Ser: 0.48 mg/dL (ref 0.44–1.00)
GFR, Estimated: >60 mL/min (ref >60)
GFR, Estimated: >60 mL/min (ref >60)
Glucose, Bld: 105 mg/dL — ABNORMAL HIGH (ref 70–99)
Glucose, Bld: 174 mg/dL — ABNORMAL HIGH (ref 70–99)
Potassium: 2.7 mmol/L — CL (ref 3.5–5.1)
Potassium: 5.1 mmol/L (ref 3.5–5.1)
Sodium: 150 mmol/L — ABNORMAL HIGH (ref 135–145)
Sodium: 143 mmol/L (ref 135–145)

## 2021-08-27 LAB — GLUCOSE, CAPILLARY
Glucose-Capillary: 158 mg/dL — ABNORMAL HIGH (ref 70–99)
Glucose-Capillary: 103 mg/dL — ABNORMAL HIGH (ref 70–99)
Glucose-Capillary: 115 mg/dL — ABNORMAL HIGH (ref 70–99)
Glucose-Capillary: 92 mg/dL (ref 70–99)

## 2021-08-27 LAB — MAGNESIUM: Magnesium: 2.5 mg/dL — ABNORMAL HIGH (ref 1.7–2.4)

## 2021-08-27 MED ORDER — POTASSIUM CHLORIDE 10 MEQ/100ML IV SOLN
10.0000 meq | INTRAVENOUS | Status: DC
Start: 2021-08-27 — End: 2021-08-27
  Administered 2021-08-27 (×2): 10 meq via INTRAVENOUS
  Filled 2021-08-27: qty 100

## 2021-08-27 MED ORDER — POTASSIUM CHLORIDE 20 MEQ PO PACK
40.0000 meq | PACK | Freq: Four times a day (QID) | ORAL | Status: DC
  Administered 2021-08-27 – 2021-08-28 (×3): 40 meq via ORAL
  Filled 2021-08-27 (×4): qty 2

## 2021-08-27 MED ORDER — POTASSIUM CHLORIDE IN NACL 20-0.45 MEQ/L-% IV SOLN
INTRAVENOUS | Status: DC
  Filled 2021-08-27: qty 1000

## 2021-08-27 MED ORDER — SODIUM CHLORIDE 0.45 % IV SOLN: INTRAVENOUS | Status: DC

## 2021-08-27 MED ORDER — POTASSIUM CHLORIDE 10 MEQ/100ML IV SOLN
10.0000 meq | INTRAVENOUS | Status: DC
Start: 2021-08-27 — End: 2021-08-27
  Administered 2021-08-27: 10 meq via INTRAVENOUS
  Filled 2021-08-27 (×2): qty 100

## 2021-08-27 MED ORDER — POTASSIUM CHLORIDE 20 MEQ PO PACK
40.0000 meq | PACK | Freq: Three times a day (TID) | ORAL | Status: DC
  Administered 2021-08-27: 40 meq via ORAL
  Filled 2021-08-27: qty 2

## 2021-08-27 NOTE — Progress Notes (Addendum)
Patient ID: Felicia Hayden, female   DOB: 26-Sep-1961, 60 y.o.   MRN: [id number] [facility] Surgery Progress Note     Subjective: CC-  Cannot tolerate potassium runs in IV. Abdomen feels better. She reports mild burning but since last night she has had no further n/v. Passing flatus and had a BM. Tolerating full liquids.  Objective: Vital signs in last 24 hours: Temp:  [98.3 F (36.8 C)-99.1 F (37.3 C)] 98.7 F (37.1 C) (10/13 0558) Pulse Rate:  [75-85] 83 (10/13 0558) Resp:  [15-20] 15 (10/13 0558) BP: (128-150)/(58-66) 141/60 (10/13 0558) SpO2:  [100 %] 100 % (10/13 0558) Last BM Date: 08/27/21  Intake/Output from previous day: 10/12 0701 - 10/13 0700 In: 379.2 [P.O.:360; IV Piggyback:19.2] Out: 1 [Stool:1] Intake/Output this shift: Total I/O In: 16.7 [I.V.:2.4; IV Piggyback:14.3] Out: -   PE: Gen:  Alert, NAD Pulm: rate and effort normal Abd: Soft, ND, +BS, nontender Psych: A&Ox4  Skin: no rashes noted, warm and dry  Lab Results:  Recent Labs    08/26/21 0405 08/27/21 0346  WBC 4.7 4.2  HGB 10.3* 9.8*  HCT 31.9* 29.7*  PLT 287 295   BMET Recent Labs    08/26/21 0405 08/27/21 0346  NA 147* 150*  K 3.6 2.7*  CL 114* 114*  CO2 26 30  GLUCOSE 144* 105*  BUN 8 8  CREATININE 0.53 0.48  CALCIUM 9.0 8.7*   PT/INR No results for input(s): LABPROT, INR in the last 72 hours. CMP     Component Value Date/Time   NA 150 (H) 08/27/2021 0346   K 2.7 (LL) 08/27/2021 0346   CL 114 (H) 08/27/2021 0346   CO2 30 08/27/2021 0346   GLUCOSE 105 (H) 08/27/2021 0346   BUN 8 08/27/2021 0346   CREATININE 0.48 08/27/2021 0346   CALCIUM 8.7 (L) 08/27/2021 0346   PROT 7.8 08/23/2021 1132   ALBUMIN 4.7 08/23/2021 1132   AST 20 08/23/2021 1132   ALT 18 08/23/2021 1132   ALKPHOS 58 08/23/2021 1132   BILITOT 0.6 08/23/2021 1132   GFRNONAA >60 08/27/2021 0346   Lipase     Component Value Date/Time   LIPASE <10 (L) 08/23/2021 1132        Studies/Results: VAS [place] LOWER EXTREMITY VENOUS (DVT)  Result Date: 08/26/2021  Lower Venous DVT Study Patient Name:  Felicia Hayden  Date of Exam:   08/25/2021 Medical Rec #: [id number]                Accession #:    [id number] Date of Birth: [birth year]                 Patient Gender: F Patient Age:   25 years Exam Location:  [facility] Procedure:      VAS [place] LOWER EXTREMITY VENOUS (DVT) Referring Phys: Imagene Sheller --------------------------------------------------------------------------------  Indications: Pain, and Swelling.  Risk Factors: Surgery. Comparison Study: No prior studies. Performing Technologist: Oliver Hum RVT  Examination Guidelines: A complete evaluation includes B-mode imaging, spectral Doppler, color Doppler, and power Doppler as needed of all accessible portions of each vessel. Bilateral testing is considered an integral part of a complete examination. Limited examinations for reoccurring indications may be performed as noted. The reflux portion of the exam is performed with the patient in reverse Trendelenburg.  +---------+---------------+---------+-----------+----------+--------------+ RIGHT    CompressibilityPhasicitySpontaneityPropertiesThrombus Aging +---------+---------------+---------+-----------+----------+--------------+ CFV      Full           Yes  Yes                                 +---------+---------------+---------+-----------+----------+--------------+ SFJ      Full                                                        +---------+---------------+---------+-----------+----------+--------------+ FV Prox  Full                                                        +---------+---------------+---------+-----------+----------+--------------+ FV Mid   Full                                                        +---------+---------------+---------+-----------+----------+--------------+ FV DistalFull                                                         +---------+---------------+---------+-----------+----------+--------------+ PFV      Full                                                        +---------+---------------+---------+-----------+----------+--------------+ POP      Full           Yes      Yes                                 +---------+---------------+---------+-----------+----------+--------------+ PTV      Full                                                        +---------+---------------+---------+-----------+----------+--------------+ PERO     Full                                                        +---------+---------------+---------+-----------+----------+--------------+   +----+---------------+---------+-----------+----------+--------------+ LEFTCompressibilityPhasicitySpontaneityPropertiesThrombus Aging +----+---------------+---------+-----------+----------+--------------+ CFV Full           Yes      Yes                                 +----+---------------+---------+-----------+----------+--------------+    Summary: RIGHT: - There is no evidence of deep vein thrombosis in the lower extremity.  -  No cystic structure found in the popliteal fossa.  LEFT: - No evidence of common femoral vein obstruction.  *See table(s) above for measurements and observations. Electronically signed by Harold Barban MD on 08/26/2021 at 9:27:55 PM.    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

## 2021-08-27 NOTE — Progress Notes (Signed)
Started potassium PIV pt in extreme pain. States burning sensation unable to tolerate. PA at bedside ordered to stop potassium and then restart at a lower rate. PT not tolerating 15ml/hr with concurrent fluid dropped it down to 56ml/hr. Currently doing ok.

## 2021-08-27 NOTE — Evaluation (Signed)
Physical Therapy Evaluation Patient Details Name: Felicia Hayden MRN: [id number] DOB: 05-06-1961 Today's Date: 08/27/2021  History of Present Illness  Patient is 60 y.o. female presented to the emergency department due to epigastric abdominal pain, nausea and vomiting. CT imaging showed concerns for possible gastric outlet obstruction, as of 10/13 having BMs. 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   Clinical Impression  Pt presents with weakness, R hip post-surgical tenderness, impaired activity tolerance, and impaired balance vs baseline. Pt to benefit from acute PT to address deficits. Pt ambulated hallway distance with supervision assist and RW, limited in distance by R hip pain and fatigue. PT recommending HHPT to safely progress mobility at home and address R hip. PT to progress mobility as tolerated, and will continue to follow acutely.         Recommendations for follow up therapy are one component of a multi-disciplinary discharge planning process, led by the attending physician.  Recommendations may be updated based on patient status, additional functional criteria and insurance authorization.  Follow Up Recommendations Supervision for mobility/OOB;Home health PT    Equipment Recommendations  None recommended by PT    Recommendations for Other Services       Precautions / Restrictions Precautions Precautions: Fall Precaution Comments: Nausea Restrictions Weight Bearing Restrictions: No      Mobility  Bed Mobility Overal bed mobility: Needs Assistance Bed Mobility: Supine to Sit;Sit to Supine     Supine to sit: Supervision Sit to supine: Min assist   General bed mobility comments: supervision for supine>sit for increased time, min assist for return to supine for RLE lifting into bed and repositioning.    Transfers Overall transfer level: Needs assistance Equipment used: Rolling walker (2 wheeled) Transfers: Sit  to/from Stand Sit to Stand: Supervision         General transfer comment: for safety, cues for hand placement.  Ambulation/Gait Ambulation/Gait assistance: Supervision Gait Distance (Feet): 100 Feet Assistive device: Rolling walker (2 wheeled) Gait Pattern/deviations: Step-through pattern;Decreased stride length;Trunk flexed Gait velocity: decr   General Gait Details: supervision for safety, pt fatiguing quickly requiring return to room stating "my right leg feels like it's going to go"  Stairs            Wheelchair Mobility    Modified Rankin (Stroke Patients Only)       Balance Overall balance assessment: Needs assistance Sitting-balance support: No upper extremity supported;Feet supported Sitting balance-Leahy Scale: Good Sitting balance - Comments: able to sit EOB without support   Standing balance support: No upper extremity supported Standing balance-Leahy Scale: Fair Standing balance comment: able to stand statically without UE support, requires RW dynamically at this time                             Pertinent Vitals/Pain Pain Assessment: Faces Faces Pain Scale: Hurts little more Pain Location: R hip Pain Descriptors / Indicators: Sore;Discomfort Pain Intervention(s): Limited activity within patient's tolerance;Monitored during session;Repositioned    Home Living Family/patient expects to be discharged to:: Private residence Living Arrangements: Spouse/significant other Available Help at Discharge: Family;Available PRN/intermittently Type of Home: House Home Access: Stairs to enter Entrance Stairs-Rails: Can reach both Entrance Stairs-Number of Steps: 5 Home Layout: Two level;Able to live on main level with bedroom/bathroom Home Equipment: Walker - 2 wheels      Prior Function Level of Independence: Independent  Comments: Pt reports walking short distances only, but was doing flight of steps and not using AD PTA.      Hand Dominance   Dominant Hand: Right    Extremity/Trunk Assessment   Upper Extremity Assessment Upper Extremity Assessment: Defer to OT evaluation    Lower Extremity Assessment Lower Extremity Assessment: RLE deficits/detail RLE Deficits / Details: anticipated post-surgical hip pain and weakness; able to perform LAQ (painful), strong quad set, DF/PF RLE Sensation: decreased light touch (around lateral upper thigh, since surgery pt reports)    Cervical / Trunk Assessment Cervical / Trunk Assessment: Normal  Communication   Communication: No difficulties  Cognition Arousal/Alertness: Awake/alert Behavior During Therapy: WFL for tasks assessed/performed Overall Cognitive Status: Within Functional Limits for tasks assessed                                        General Comments General comments (skin integrity, edema, etc.): IV leaking, RN notified    Exercises     Assessment/Plan    PT Assessment Patient needs continued PT services  PT Problem List Decreased strength;Decreased mobility;Pain;Decreased balance;Decreased knowledge of use of DME;Decreased activity tolerance;Decreased safety awareness;Decreased range of motion       PT Treatment Interventions DME instruction;Therapeutic activities;Gait training;Therapeutic exercise;Patient/family education;Balance training;Stair training;Functional mobility training    PT Goals (Current goals can be found in the Care Plan section)  Acute Rehab PT Goals Patient Stated Goal: get better, make sure my hip is good PT Goal Formulation: With patient Time For Goal Achievement: 09/10/21 Potential to Achieve Goals: Good    Frequency Min 4X/week   Barriers to discharge        Co-evaluation               AM-PAC PT "6 Clicks" Mobility  Outcome Measure Help needed turning from your back to your side while in a flat bed without using bedrails?: A Little Help needed moving from lying on your back to  sitting on the side of a flat bed without using bedrails?: A Little Help needed moving to and from a bed to a chair (including a wheelchair)?: A Little Help needed standing up from a chair using your arms (e.g., wheelchair or bedside chair)?: A Little Help needed to walk in hospital room?: A Little Help needed climbing 3-5 steps with a railing? : A Little 6 Click Score: 18    End of Session   Activity Tolerance: Patient tolerated treatment well Patient left: in bed;with call bell/phone within reach;with bed alarm set Nurse Communication: Mobility status PT Visit Diagnosis: Other abnormalities of gait and mobility (R26.89);Muscle weakness (generalized) (M62.81)    Time: 3295-1884 PT Time Calculation (min) (ACUTE ONLY): 21 min   Charges:   PT Evaluation $PT Eval Low Complexity: 1 Low          Trisa Cranor S, PT DPT Acute Rehabilitation Services Pager [phone]  Office [phone]   Blanco E Stroup 08/27/2021, 1:20 PM

## 2021-08-27 NOTE — Progress Notes (Signed)
    OVERNIGHT PROGRESS REPORT  Notified by RN for patient unable/refused last dose of oral potassium supplement. Morning potassium lab result of 2.7 mmol/L.  IV potassium replacement is ordered  Gershon Cull MSNA MSN Hayden

## 2021-08-27 NOTE — Progress Notes (Signed)
Pt continues to refuse CPAP qhs.  Pt encouraged to contact RT should she change her mind. 

## 2021-08-27 NOTE — Plan of Care (Signed)
  Problem: Health Behavior/Discharge Planning: Goal: Ability to manage health-related needs will improve Outcome: Progressing   Problem: Education: Goal: Knowledge of General Education information will improve Description: Including pain rating scale, medication(s)/side effects and non-pharmacologic comfort measures Outcome: Progressing   Problem: Health Behavior/Discharge Planning: Goal: Ability to manage health-related needs will improve Outcome: Progressing   Problem: Clinical Measurements: Goal: Will remain free from infection Outcome: Progressing

## 2021-08-27 NOTE — Progress Notes (Signed)
Progress Note:    Felicia Hayden    AYT:[id number] DOB: 01-27-1961 DOA: 08/23/2021  PCP: Jonathon Jordan, MD    Brief Narrative:   60 year old female with a past medical history of insulin-dependent diabetes mellitus, OSA, laparoscopic gastric banding in 2005, recent right total hip arthroplasty on 08/14/2021.  The patient presented to the emergency department due to epigastric abdominal pain, nausea and vomiting.  Stated her hip pain was well controlled and she was ambulating at home without any difficulty.  States that her hip pain is well controlled and she was ambulating without difficulty.  States that her hip pain is well controlled and she is ambulating without difficulty.   Subjective:   Patient mentions that she had a better day yesterday compared to the day before.  Did not have any further episodes of vomiting.  Has been having bowel movements.  Denies any other complaints.    Assessment and Plan:   Gastric ileus Presented with abdominal pain nausea vomiting.  General surgery was consulted. Patient seems to be doing better.  Had bowel movements.  Abdomen is benign to examination.  She feels better this morning. Reflux symptoms have improved.  Diet has been advanced.   Case was also discussed with gastroenterology yesterday who recommended continuing current management. Abdominal film was repeated on 10/11 which showed resolution of the gastric distention.  Euglycemic DKA/insulin-dependent diabetes mellitus Appears to have resolved.  Transitioned back to subcutaneous insulin.  Likely triggered by Vania Rea which has been discontinued.  CBGs.  Continue SSI.  HbA1c 6.2.  Normocytic anemia Drop in hemoglobin is dilutional.  No evidence of overt bleeding.  Hypokalemia/hypernatremia Did not tolerate oral potassium levels ordered yesterday.  Potassium noted to be lower today.  Being aggressively repleted.  We will recheck labs later today.   Start IV fluids as she  appears to be volume depleted.   Sodium level has gone up.  We will recheck labs later today and tomorrow.  Obstructive sleep apnea Continue CPAP.  Recent right total hip arthroplasty PT and OT evaluation.  Surgery was performed by Dr. Lyla Glassing.  No DVT noted on Doppler studies.  Pulmonary nodules Tiny subpleural nodules noted bilaterally on CT angiogram.  Will need outpatient follow-up.     Other information:    DVT prophylaxis: Lovenox Code Status: Full code Family Communication: None present Disposition: Hopefully return home in improved  Status is: Inpatient  Remains inpatient appropriate because:Ongoing diagnostic testing needed not appropriate for outpatient work up  Dispo: The patient is from: Home              Anticipated d/c is to: Home              Patient currently is not medically stable to d/c.   Difficult to place patient No    Consultants:   General surgery    Objective:    Vitals:   08/26/21 0853 08/26/21 1307 08/26/21 2110 08/27/21 0558  BP: (!) 128/58 (!) 150/65 140/66 (!) 141/60  Pulse: 75 80 85 83  Resp: 20 20 16 15   Temp: 98.3 F (36.8 C) 99.1 F (37.3 C) 98.7 F (37.1 C) 98.7 F (37.1 C)  TempSrc: Oral Oral Oral Oral  SpO2: 100% 100% 100% 100%  Weight:      Height:        Intake/Output Summary (Last 24 hours) at 08/27/2021 1022 Last data filed at 08/27/2021 0747 Gross per 24 hour  Intake 395.86 ml  Output 1 ml  Net 394.86 ml    Filed Weights   08/23/21 1123  Weight: 91.2 kg       Physical Exam:    General appearance: Awake alert.  In no distress Resp: Clear to auscultation bilaterally.  Normal effort Cardio: S1-S2 is normal regular.  No S3-S4.  No rubs murmurs or bruit GI: Abdomen is soft.  Nontender nondistended.  Bowel sounds are present normal.  No masses organomegaly Extremities: No edema.  Full range of motion of lower extremities. Neurologic: Alert and oriented x3.  No focal neurological deficits.        Data Reviewed:    I have personally reviewed following labs and imaging studies  CBC: Recent Labs  Lab 08/23/21 1132 08/24/21 0521 08/25/21 0448 08/26/21 0405 08/27/21 0346  WBC 13.2* 14.6* 7.6 4.7 4.2  HGB 12.5 10.4* 9.7* 10.3* 9.8*  HCT 40.9 34.5* 30.4* 31.9* 29.7*  MCV 87.0 88.9 84.0 83.9 83.7  PLT 497* 382 303 287 295     Basic Metabolic Panel: Recent Labs  Lab 08/24/21 0521 08/24/21 1331 08/25/21 0002 08/25/21 0448 08/25/21 1224 08/26/21 0405 08/27/21 0346  NA 137   < > 142 141 140 147* 150*  K 3.5   < > 2.6* 2.8* 2.5* 3.6 2.7*  CL 114*   < > 118* 116* 112* 114* 114*  CO2 8*   < > 14* 17* 20* 26 30  GLUCOSE 174*   < > 144* 158* 278* 144* 105*  BUN 11   < > 10 9 9 8 8   CREATININE 0.82   < > 0.60 0.59 0.61 0.53 0.48  CALCIUM 8.6*   < > 9.0 9.0 8.7* 9.0 8.7*  MG 2.2  --   --  2.4  --   --  2.5*   < > = values in this interval not displayed.     GFR: Estimated Creatinine Clearance: 84.1 mL/min (by C-G formula based on SCr of 0.48 mg/dL).  Liver Function Tests: Recent Labs  Lab 08/23/21 1132  AST 20  ALT 18  ALKPHOS 58  BILITOT 0.6  PROT 7.8  ALBUMIN 4.7     CBG: Recent Labs  Lab 08/26/21 0731 08/26/21 1124 08/26/21 1636 08/26/21 2049 08/27/21 0726  GLUCAP 136* 155* 97 250* 158*      Recent Results (from the past 240 hour(s))  Culture, blood (single) w Reflex to ID Panel     Status: None (Preliminary result)   Collection Time: 08/23/21  3:13 PM   Specimen: BLOOD  Result Value Ref Range Status   Specimen Description   Final    BLOOD BLOOD LEFT FOREARM Performed at [facility], [address] [place], [place] [address]    Special Requests   Final    BOTTLES DRAWN AEROBIC AND ANAEROBIC Blood Culture adequate volume Performed at [facility], [address] [place], [place] [address]    Culture   Final    NO GROWTH 4 DAYS Performed at [facility], [place] [address],  [place],  [address]    Report Status PENDING  Incomplete  Resp Panel by RT-PCR (Flu A&B, Covid) Nasopharyngeal Swab     Status: None   Collection Time: 08/23/21  5:25 PM   Specimen: Nasopharyngeal Swab; Nasopharyngeal(NP) swabs in vial transport medium  Result Value Ref Range Status   SARS Coronavirus 2 by RT PCR NEGATIVE NEGATIVE Final    Comment: (NOTE) SARS-CoV-2 target nucleic acids are NOT DETECTED.  The SARS-CoV-2 RNA is generally  detectable in upper respiratory specimens during the acute phase of infection. The lowest concentration of SARS-CoV-2 viral copies this assay can detect is 138 copies/mL. A negative result does not preclude SARS-Cov-2 infection and should not be used as the sole basis for treatment or other patient management decisions. A negative result may occur with  improper specimen collection/handling, submission of specimen other than nasopharyngeal swab, presence of viral mutation(s) within the areas targeted by this assay, and inadequate number of viral copies(<138 copies/mL). A negative result must be combined with clinical observations, patient history, and epidemiological information. The expected result is Negative.  Fact Sheet for Patients:  EntrepreneurPulse.com.au  Fact Sheet for Healthcare Providers:  IncredibleEmployment.be  This test is no t yet approved or cleared by the [place] FDA and  has been authorized for detection and/or diagnosis of SARS-CoV-2 by FDA under an Emergency Use Authorization (EUA). This EUA will remain  in effect (meaning this test can be used) for the duration of the COVID-19 declaration under Section 564(b)(1) of the Act, 21 U.S.C.section 360bbb-3(b)(1), unless the authorization is terminated  or revoked sooner.       Influenza A by PCR NEGATIVE NEGATIVE Final   Influenza B by PCR NEGATIVE NEGATIVE Final    Comment: (NOTE) The Xpert Xpress SARS-CoV-2/FLU/RSV plus assay is  intended as an aid in the diagnosis of influenza from Nasopharyngeal swab specimens and should not be used as a sole basis for treatment. Nasal washings and aspirates are unacceptable for Xpert Xpress SARS-CoV-2/FLU/RSV testing.  Fact Sheet for Patients: EntrepreneurPulse.com.au  Fact Sheet for Healthcare Providers: IncredibleEmployment.be  This test is not yet approved or cleared by the [place] FDA and has been authorized for detection and/or diagnosis of SARS-CoV-2 by FDA under an Emergency Use Authorization (EUA). This EUA will remain in effect (meaning this test can be used) for the duration of the COVID-19 declaration under Section 564(b)(1) of the Act, 21 U.S.C. section 360bbb-3(b)(1), unless the authorization is terminated or revoked.  Performed at [facility], [address] [place], [place] [address]   MRSA Next Gen by PCR, Nasal     Status: None   Collection Time: 08/24/21  1:24 PM   Specimen: Nasal Mucosa; Nasal Swab  Result Value Ref Range Status   MRSA by PCR Next Gen NOT DETECTED NOT DETECTED Final    Comment: (NOTE) The GeneXpert MRSA Assay (FDA approved for NASAL specimens only), is one component of a comprehensive MRSA colonization surveillance program. It is not intended to diagnose MRSA infection nor to guide or monitor treatment for MRSA infections. Test performance is not FDA approved in patients less than 43 years old. Performed at [facility], [place] [address], [place],  [address]           Radiology Studies:    VAS [place] LOWER EXTREMITY VENOUS (DVT)  Result Date: 08/26/2021  Lower Venous DVT Study Patient Name:  Felicia Hayden  Date of Exam:   08/25/2021 Medical Rec #: [id number]                Accession #:    [id number] Date of Birth: 11-17-1960                 Patient Gender: F Patient Age:   76 years Exam Location:  [facility] Procedure:       VAS [place] LOWER EXTREMITY VENOUS (DVT) Referring Phys: Imagene Sheller --------------------------------------------------------------------------------  Indications: Pain, and Swelling.  Risk Factors: Surgery. Comparison  Study: No prior studies. Performing Technologist: Oliver Hum RVT  Examination Guidelines: A complete evaluation includes B-mode imaging, spectral Doppler, color Doppler, and power Doppler as needed of all accessible portions of each vessel. Bilateral testing is considered an integral part of a complete examination. Limited examinations for reoccurring indications may be performed as noted. The reflux portion of the exam is performed with the patient in reverse Trendelenburg.  +---------+---------------+---------+-----------+----------+--------------+ RIGHT    CompressibilityPhasicitySpontaneityPropertiesThrombus Aging +---------+---------------+---------+-----------+----------+--------------+ CFV      Full           Yes      Yes                                 +---------+---------------+---------+-----------+----------+--------------+ SFJ      Full                                                        +---------+---------------+---------+-----------+----------+--------------+ FV Prox  Full                                                        +---------+---------------+---------+-----------+----------+--------------+ FV Mid   Full                                                        +---------+---------------+---------+-----------+----------+--------------+ FV DistalFull                                                        +---------+---------------+---------+-----------+----------+--------------+ PFV      Full                                                        +---------+---------------+---------+-----------+----------+--------------+ POP      Full           Yes      Yes                                  +---------+---------------+---------+-----------+----------+--------------+ PTV      Full                                                        +---------+---------------+---------+-----------+----------+--------------+ PERO     Full                                                        +---------+---------------+---------+-----------+----------+--------------+   +----+---------------+---------+-----------+----------+--------------+  LEFTCompressibilityPhasicitySpontaneityPropertiesThrombus Aging +----+---------------+---------+-----------+----------+--------------+ CFV Full           Yes      Yes                                 +----+---------------+---------+-----------+----------+--------------+    Summary: RIGHT: - There is no evidence of deep vein thrombosis in the lower extremity.  - No cystic structure found in the popliteal fossa.  LEFT: - No evidence of common femoral vein obstruction.  *See table(s) above for measurements and observations. Electronically signed by Harold Barban MD on 08/26/2021 at 9:27:55 PM.    Final         Medications:    Scheduled Meds:  bisacodyl  10 mg Rectal Once   enoxaparin (LOVENOX) injection  40 mg Subcutaneous Daily   insulin aspart  0-20 Units Subcutaneous TID WC   insulin aspart  0-5 Units Subcutaneous QHS   insulin glargine-yfgn  20 Units Subcutaneous QAC breakfast   metoCLOPramide (REGLAN) injection  5 mg Intravenous Q6H   pantoprazole (PROTONIX) IV  40 mg Intravenous Q24H   potassium chloride  40 mEq Oral TID   Continuous Infusions:  0.45 % NaCl with KCl 20 mEq / L 75 mL/hr at 08/27/21 0747   sodium chloride Stopped (08/27/21 0243)   famotidine (PEPCID) IV 20 mg (08/27/21 1009)   potassium chloride 10 mEq (08/27/21 1004)       LOS: 3 days   Bonnielee Haff, MD [facility]  08/27/2021, 10:22 AM

## 2021-08-28 DIAGNOSIS — K3189 Other diseases of stomach and duodenum: Secondary | ICD-10-CM | POA: Diagnosis not present

## 2021-08-28 LAB — BASIC METABOLIC PANEL
Anion gap: 5 (ref 5–15)
BUN: 6 mg/dL — ABNORMAL LOW (ref 8–23)
CO2: 29 mmol/L (ref 22–32)
Calcium: 8.6 mg/dL — ABNORMAL LOW (ref 8.9–10.3)
Chloride: 108 mmol/L (ref 98–111)
Creatinine, Ser: 0.48 mg/dL (ref 0.44–1.00)
GFR, Estimated: >60 mL/min (ref >60)
Glucose, Bld: 172 mg/dL — ABNORMAL HIGH (ref 70–99)
Potassium: 4.2 mmol/L (ref 3.5–5.1)
Sodium: 142 mmol/L (ref 135–145)

## 2021-08-28 LAB — GLUCOSE, CAPILLARY
Glucose-Capillary: 156 mg/dL — ABNORMAL HIGH (ref 70–99)
Glucose-Capillary: 72 mg/dL (ref 70–99)

## 2021-08-28 LAB — CULTURE, BLOOD (SINGLE)
Culture: NO GROWTH
Special Requests: ADEQUATE

## 2021-08-28 LAB — CBC
HCT: 32.5 % — ABNORMAL LOW (ref 36.0–46.0)
Hemoglobin: 10.5 g/dL — ABNORMAL LOW (ref 12.0–15.0)
MCH: 27.1 pg (ref 26.0–34.0)
MCHC: 32.3 g/dL (ref 30.0–36.0)
MCV: 83.8 fL (ref 80.0–100.0)
Platelets: 259 10*3/uL (ref 150–400)
RBC: 3.88 MIL/uL (ref 3.87–5.11)
RDW: 15.5 % (ref 11.5–15.5)
WBC: 4.4 10*3/uL (ref 4.0–10.5)
nRBC: 0.5 % — ABNORMAL HIGH (ref 0.0–0.2)

## 2021-08-28 MED ORDER — ALUM & MAG HYDROXIDE-SIMETH 200-200-20 MG/5ML PO SUSP: 30.0000 mL | ORAL | 0 refills | Status: DC | PRN

## 2021-08-28 MED ORDER — FAMOTIDINE 20 MG PO TABS: 20.0000 mg | ORAL_TABLET | Freq: Two times a day (BID) | ORAL | 0 refills | Status: DC

## 2021-08-28 MED ORDER — FAMOTIDINE 20 MG PO TABS
20.0000 mg | ORAL_TABLET | Freq: Two times a day (BID) | ORAL | Status: DC
  Administered 2021-08-28: 20 mg via ORAL
  Filled 2021-08-28: qty 1

## 2021-08-28 MED ORDER — PANTOPRAZOLE SODIUM 40 MG PO TBEC: 40.0000 mg | DELAYED_RELEASE_TABLET | Freq: Every day | ORAL | 1 refills | Status: DC

## 2021-08-28 MED ORDER — METOCLOPRAMIDE HCL 5 MG PO TABS: 5.0000 mg | ORAL_TABLET | Freq: Three times a day (TID) | ORAL | 0 refills | Status: DC | PRN

## 2021-08-28 MED ORDER — PANTOPRAZOLE SODIUM 40 MG PO TBEC
40.0000 mg | DELAYED_RELEASE_TABLET | Freq: Every day | ORAL | Status: DC
  Administered 2021-08-28: 40 mg via ORAL
  Filled 2021-08-28: qty 1

## 2021-08-28 NOTE — Progress Notes (Signed)
Patient ID: Keyleen Cerrato, female   DOB: Apr 03, 1961, 60 y.o.   MRN: [id number] [facility] Surgery Progress Note     Subjective: CC-  Feeling much better today. Denies any current abdominal pain, nausea, or vomiting. Epigastric burning much improved. Just finished eating solid food for breakfast and is tolerating thus far. BM yesterday.  Objective: Vital signs in last 24 hours: Temp:  [97.5 F (36.4 C)-98.3 F (36.8 C)] 97.5 F (36.4 C) (10/14 0428) Pulse Rate:  [78-90] 80 (10/14 0428) Resp:  [18] 18 (10/14 0428) BP: (132-149)/(59-72) 132/59 (10/14 0428) SpO2:  [100 %] 100 % (10/14 0428) Last BM Date: 08/27/21  Intake/Output from previous day: 10/13 0701 - 10/14 0700 In: 306.7 [P.O.:240; I.V.:2.4; IV Piggyback:64.3] Out: -  Intake/Output this shift: No intake/output data recorded.  PE: Gen:  Alert, NAD Pulm: rate and effort normal Abd: Soft, ND, +BS, nontender Psych: A&Ox4  Skin: no rashes noted, warm and dry  Lab Results:  Recent Labs    08/27/21 0346 08/28/21 0455  WBC 4.2 4.4  HGB 9.8* 10.5*  HCT 29.7* 32.5*  PLT 295 259   BMET Recent Labs    08/27/21 1522 08/28/21 0455  NA 143 142  K 5.1 4.2  CL 107 108  CO2 27 29  GLUCOSE 174* 172*  BUN 8 6*  CREATININE 0.48 0.48  CALCIUM 8.6* 8.6*   PT/INR No results for input(s): LABPROT, INR in the last 72 hours. CMP     Component Value Date/Time   NA 142 08/28/2021 0455   K 4.2 08/28/2021 0455   CL 108 08/28/2021 0455   CO2 29 08/28/2021 0455   GLUCOSE 172 (H) 08/28/2021 0455   BUN 6 (L) 08/28/2021 0455   CREATININE 0.48 08/28/2021 0455   CALCIUM 8.6 (L) 08/28/2021 0455   PROT 7.8 08/23/2021 1132   ALBUMIN 4.7 08/23/2021 1132   AST 20 08/23/2021 1132   ALT 18 08/23/2021 1132   ALKPHOS 58 08/23/2021 1132   BILITOT 0.6 08/23/2021 1132   GFRNONAA >60 08/28/2021 0455   Lipase     Component Value Date/Time   LIPASE <10 (L) 08/23/2021 1132       Studies/Results: No results  found.  Anti-infectives: Anti-infectives (From admission, onward)    None        Assessment/Plan Gastric ileus Recent right THA 08/14/21 Hx gastric banding 2005 - on  reglan - switch to PO protonix and pepcid  - Tolerating diet and having bowel function. Recommend continuing reflux medications at discharge. [place] for discharge if she tolerates lunch. Follow up with surgery PRN. We will sign off, call with questions or concerns.   ID - none FEN - IVF, soft diet VTE - lovenox Foley - none   DM DKA - improved OSA   LOS: 4 days    Wellington Hampshire, [facility] Surgery 08/28/2021, 8:51 AM Please see Amion for pager number during day hours 7:00am-4:30pm

## 2021-08-28 NOTE — TOC Transition Note (Signed)
Transition of Care [facility]) - CM/SW Discharge Note   Patient Details  Name: Felicia Hayden MRN: [id number] Date of Birth: 09-Oct-1961  Transition of Care [facility] CM/SW Contact:  Trish Mage, LCSW Phone Number: 08/28/2021, 10:11 AM   Clinical Narrative:   Patient seen in follow up to PT recommendation of [facility].  [name] lives with her husband and mother-in-law here in [place].  She has a rolling walker, plans to return home, and is open to the idea of [facility] services.  No preference of agencies stated.  Contacted Cindie with [facility] who agrees to provide [facility] services. TOC will continue to follow during the course of hospitalization.     Final next level of care: [employer] Barriers to Discharge: Barriers Resolved   Patient Goals and CMS Choice Patient states their goals for this hospitalization and ongoing recovery are:: to return to my home CMS Medicare.gov Compare Post Acute Care list provided to:: Patient    Discharge Placement                       Discharge Plan and Services   Discharge Planning Services: CM Consult                                 Social Determinants of Health (SDOH) Interventions     Readmission Risk Interventions No flowsheet data found.

## 2021-08-28 NOTE — Discharge Summary (Signed)
[facility]  Physician Discharge Summary   Patient ID: Felicia Hayden MRN: [id number] DOB/AGE: 02-23-1961 60 y.o.  Admit date: 08/23/2021 Discharge date: 08/28/2021    PCP: Jonathon Jordan, MD  DISCHARGE DIAGNOSES:  Gastric ileus, resolved Euglycemic DKA, resolved Insulin-dependent diabetes mellitus Normocytic anemia Obstructive sleep apnea Recent right hip arthroplasty Pulmonary nodules  RECOMMENDATIONS FOR OUTPATIENT FOLLOW UP: Patient may need repeat CT scan in 12 months for the tiny subpleural nodules noted on CT chest.   Home Health: Home health PT Equipment/Devices: None  CODE STATUS: Full code  DISCHARGE CONDITION: fair  Diet recommendation: Modified carbohydrate  INITIAL HISTORY: 60 year old female with a past medical history of insulin-dependent diabetes mellitus, OSA, laparoscopic gastric banding in 2005, recent right total hip arthroplasty on 08/14/2021.  The patient presented to the emergency department due to epigastric abdominal pain, nausea and vomiting.  Stated her hip pain was well controlled and she was ambulating at home without any difficulty.  States that her hip pain is well controlled and she was ambulating without difficulty.  States that her hip pain is well controlled and she is ambulating without difficulty.  Consultations: General surgery  Procedures: None   HOSPITAL COURSE:   Gastric ileus Presented with abdominal pain nausea vomiting.  General surgery was consulted.  Conservative management was pursued.  Patient slowly improved.  Started having bowel movements.  Diet was slowly advanced.  She also experienced significant reflux for which she was given PPI as well as Pepcid.  Has significantly improved.  Tolerating her diet.  Cleared by general surgery for discharge.   Euglycemic DKA/insulin-dependent diabetes mellitus Appears to have resolved.  Transitioned back to subcutaneous insulin.  Likely triggered by Vania Rea which  has been discontinued.  HbA1c 6.2.  Resume other home medication regimen.   Normocytic anemia Drop in hemoglobin is dilutional.  No evidence of overt bleeding.  Hypokalemia/hypernatremia Potassium level has improved with aggressive repletion.  Magnesium was 2.5.  Sodium level has improved.   Obstructive sleep apnea Continue CPAP.  Recent right total hip arthroplasty Surgery was performed by Dr. Lyla Glassing.  No DVT noted on Doppler studies.  Seen by physical therapy.  Home health has been ordered.  Pulmonary nodules Tiny subpleural nodules noted bilaterally on CT angiogram.  Will need outpatient follow-up.   Obesity Estimated body mass index is 32.44 kg/m as calculated from the following:   Height as of this encounter: 5\' 6"  (1.676 m).   Weight as of this encounter: 91.2 kg.  Patient is feeling better.  Looking forward to going home today.  Okay for discharge.   PERTINENT LABS:  The results of significant diagnostics from this hospitalization (including imaging, microbiology, ancillary and laboratory) are listed below for reference.    Microbiology: Recent Results (from the past 240 hour(s))  Culture, blood (single) w Reflex to ID Panel     Status: None   Collection Time: 08/23/21  3:13 PM   Specimen: BLOOD  Result Value Ref Range Status   Specimen Description   Final    BLOOD BLOOD LEFT FOREARM Performed at [facility], [address] [place], [place] [address]    Special Requests   Final    BOTTLES DRAWN AEROBIC AND ANAEROBIC Blood Culture adequate volume Performed at [facility], [address], [place], [place] [address]    Culture   Final    NO GROWTH 5 DAYS Performed at [facility], [place] [address], [place], [place] [address]    Report Status  08/28/2021 FINAL  Final  Resp Panel by RT-PCR (Flu A&B, Covid) Nasopharyngeal Swab     Status: None   Collection Time: 08/23/21  5:25 PM   Specimen: Nasopharyngeal Swab;  Nasopharyngeal(NP) swabs in vial transport medium  Result Value Ref Range Status   SARS Coronavirus 2 by RT PCR NEGATIVE NEGATIVE Final    Comment: (NOTE) SARS-CoV-2 target nucleic acids are NOT DETECTED.  The SARS-CoV-2 RNA is generally detectable in upper respiratory specimens during the acute phase of infection. The lowest concentration of SARS-CoV-2 viral copies this assay can detect is 138 copies/mL. A negative result does not preclude SARS-Cov-2 infection and should not be used as the sole basis for treatment or other patient management decisions. A negative result may occur with  improper specimen collection/handling, submission of specimen other than nasopharyngeal swab, presence of viral mutation(s) within the areas targeted by this assay, and inadequate number of viral copies(<138 copies/mL). A negative result must be combined with clinical observations, patient history, and epidemiological information. The expected result is Negative.  Fact Sheet for Patients:  EntrepreneurPulse.com.au  Fact Sheet for Healthcare Providers:  IncredibleEmployment.be  This test is no t yet approved or cleared by the [place] FDA and  has been authorized for detection and/or diagnosis of SARS-CoV-2 by FDA under an Emergency Use Authorization (EUA). This EUA will remain  in effect (meaning this test can be used) for the duration of the COVID-19 declaration under Section 564(b)(1) of the Act, 21 U.S.C.section 360bbb-3(b)(1), unless the authorization is terminated  or revoked sooner.       Influenza A by PCR NEGATIVE NEGATIVE Final   Influenza B by PCR NEGATIVE NEGATIVE Final    Comment: (NOTE) The Xpert Xpress SARS-CoV-2/FLU/RSV plus assay is intended as an aid in the diagnosis of influenza from Nasopharyngeal swab specimens and should not be used as a sole basis for treatment. Nasal washings and aspirates are unacceptable for Xpert Xpress  SARS-CoV-2/FLU/RSV testing.  Fact Sheet for Patients: EntrepreneurPulse.com.au  Fact Sheet for Healthcare Providers: IncredibleEmployment.be  This test is not yet approved or cleared by the [place] FDA and has been authorized for detection and/or diagnosis of SARS-CoV-2 by FDA under an Emergency Use Authorization (EUA). This EUA will remain in effect (meaning this test can be used) for the duration of the COVID-19 declaration under Section 564(b)(1) of the Act, 21 U.S.C. section 360bbb-3(b)(1), unless the authorization is terminated or revoked.  Performed at [facility], [address] [place], [place] [address]   MRSA Next Gen by PCR, Nasal     Status: None   Collection Time: 08/24/21  1:24 PM   Specimen: Nasal Mucosa; Nasal Swab  Result Value Ref Range Status   MRSA by PCR Next Gen NOT DETECTED NOT DETECTED Final    Comment: (NOTE) The GeneXpert MRSA Assay (FDA approved for NASAL specimens only), is one component of a comprehensive MRSA colonization surveillance program. It is not intended to diagnose MRSA infection nor to guide or monitor treatment for MRSA infections. Test performance is not FDA approved in patients less than 64 years old. Performed at [facility], [place] [address], [place], [place] [address]      Labs:  COVID-19 Labs   Lab Results  Component Value Date   [place] NEGATIVE 08/23/2021      Basic Metabolic Panel: Recent Labs  Lab 08/24/21 0521 08/24/21 1331 08/25/21 0448 08/25/21 1224 08/26/21 0405 08/27/21 0346 08/27/21 1522 08/28/21 0455  NA 137   < >  141 140 147* 150* 143 142  K 3.5   < > 2.8* 2.5* 3.6 2.7* 5.1 4.2  CL 114*   < > 116* 112* 114* 114* 107 108  CO2 8*   < > 17* 20* 26 30 27 29   GLUCOSE 174*   < > 158* 278* 144* 105* 174* 172*  BUN 11   < > 9 9 8 8 8  6*  CREATININE 0.82   < > 0.59 0.61 0.53 0.48 0.48 0.48  CALCIUM 8.6*   < > 9.0 8.7*  9.0 8.7* 8.6* 8.6*  MG 2.2  --  2.4  --   --  2.5*  --   --    < > = values in this interval not displayed.   Liver Function Tests: Recent Labs  Lab 08/23/21 1132  AST 20  ALT 18  ALKPHOS 58  BILITOT 0.6  PROT 7.8  ALBUMIN 4.7   Recent Labs  Lab 08/23/21 1132  LIPASE <10*    CBC: Recent Labs  Lab 08/24/21 0521 08/25/21 0448 08/26/21 0405 08/27/21 0346 08/28/21 0455  WBC 14.6* 7.6 4.7 4.2 4.4  HGB 10.4* 9.7* 10.3* 9.8* 10.5*  HCT 34.5* 30.4* 31.9* 29.7* 32.5*  MCV 88.9 84.0 83.9 83.7 83.8  PLT 382 303 287 295 259     CBG: Recent Labs  Lab 08/27/21 1119 08/27/21 1705 08/27/21 2041 08/28/21 0721 08/28/21 1147  GLUCAP 103* 115* 92 156* 72     IMAGING STUDIES CT Angio Chest PE W and/or Wo Contrast  Result Date: 08/23/2021 CLINICAL DATA:  Constipation, epigastric pain since yesterday, vomiting today, nausea, diabetes mellitus, hyperlipidemia, former smoker EXAM: CT ANGIOGRAPHY CHEST WITH CONTRAST TECHNIQUE: Multidetector CT imaging of the chest was performed using the standard protocol during bolus administration of intravenous contrast. Multiplanar CT image reconstructions and MIPs were obtained to evaluate the vascular anatomy. CONTRAST:  171mL OMNIPAQUE IOHEXOL 350 MG/ML SOLN IV COMPARISON:  None FINDINGS: Cardiovascular: Aorta normal caliber with atherosclerotic calcifications. No aortic aneurysm or dissection. Heart unremarkable. No pericardial effusion. Pulmonary arteries adequately opacified and patent. No evidence of pulmonary embolism. Mediastinum/Nodes: Base of cervical region normal appearance. Esophagus unremarkable. Lap band near GE junction. No thoracic adenopathy Lungs/Pleura: Subsegmental atelectasis RIGHT middle lobe medially. Lungs otherwise clear. No pulmonary infiltrate, pleural effusion, or pneumothorax. Questionable 2 mm subpleural nodule LEFT lower lobe image 84. Additional questionable subpleural nodule RIGHT upper lobe 3 mm image 41. Upper  Abdomen: Distended stomach below laparoscopic gastric band. Low-attenuation foci within liver question small cysts Musculoskeletal: No acute osseous findings. Review of the MIP images confirms the above findings. IMPRESSION: No evidence of pulmonary embolism. Subsegmental atelectasis RIGHT middle lobe medially. Questionable tiny subpleural nodules in both lungs; no follow-up needed if patient is low-risk (and has no known or suspected primary neoplasm). Non-contrast chest CT can be considered in 12 months if patient is high-risk. This recommendation follows the consensus statement: Guidelines for Management of Incidental Pulmonary Nodules Detected on CT Images: From the Fleischner Society 2017; Radiology 2017; 284:228-243. Electronically Signed   By: Lavonia Dana M.D.   On: 08/23/2021 16:27   US SOFT TISSUE HEAD & NECK (NON-THYROID)  Result Date: 08/25/2021 CLINICAL DATA:  Bilateral submandibular pain for 2 days EXAM: ULTRASOUND OF HEAD/NECK SOFT TISSUES TECHNIQUE: Ultrasound examination of the head and neck soft tissues was performed in the area of clinical concern. COMPARISON:  None. FINDINGS: No discrete sonographic abnormality identified on targeted evaluation of the area of concern in the submandibular regions bilaterally.  IMPRESSION: Unremarkable sonographic evaluation of the area of patient's pain in the submandibular region. Electronically Signed   By: Miachel Roux M.D.   On: 08/25/2021 10:27   CT ABDOMEN PELVIS W CONTRAST  Result Date: 08/23/2021 CLINICAL DATA:  Epigastric pain, nausea, vomiting, prior laparoscopic gastric banding. Recent hip replacement surgery 08/14/2021 EXAM: CT ABDOMEN AND PELVIS WITH CONTRAST TECHNIQUE: Multidetector CT imaging of the abdomen and pelvis was performed using the standard protocol following bolus administration of intravenous contrast. Sagittal and coronal MPR images reconstructed from axial data set. CONTRAST:  123mL OMNIPAQUE IOHEXOL 350 MG/ML SOLN IV. Dilute  oral contrast. COMPARISON:  None FINDINGS: Lower chest: Questionable tiny subpleural nodule LEFT lower lobe see CT chest report. Remaining lung bases clear. Hepatobiliary: Small lesions within liver, some representing cysts, additional of which are indeterminate. Unremarkable gallbladder. No biliary dilatation. Pancreas: Atrophic pancreas without mass Spleen: Normal appearance.  Small splenule inferior to spleen. Adrenals/Urinary Tract: Adrenal glands, kidneys, ureters, and bladder normal appearance Stomach/Bowel: Normal appendix. Prior laparoscopic gastric banding. Question partial small bowel malrotation with majority of small bowel loops in RIGHT upper quadrant. Stomach distal to gastric band is distended by contrast, though contrast has passed into small bowel loops. Decompressed duodenal bulb and descending duodenum. Component of pyloric stenosis or gastric outlet obstruction not completely excluded. Vascular/Lymphatic: Scattered pelvic phleboliths. Atherosclerotic calcifications aorta and iliac arteries without aneurysm. Vascular structures patent. No adenopathy. Reproductive: Multiple uterine nodules likely representing leiomyomata. LEFT lateral leiomyoma posteriorly 2.7 x 3.1 x 3.6 cm. RIGHT superior fundal leiomyoma 2.6 x 2.3 x 2.6 cm. Small nodules less than 1 cm. Additional mass superior to the uterine fundus, favor exophytic leiomyoma 4.3 x 3.7 x 4.0 cm. Atrophic ovaries. Other: No free air or free fluid.  No hernia. Musculoskeletal: RIGHT hip prosthesis. Overlying soft tissue swelling and tissue infiltration consistent with recent surgery. Multiple foci of soft tissue gas and a small amount of fluid are identified both within and superficial to the muscles, may represent resolving postsurgical changes but infection not excluded. No other osseous abnormalities. IMPRESSION: Prior laparoscopic gastric banding with stomach distal to gastric band distended by contrast, though contrast has passed into small  bowel loops; component of pyloric stenosis or gastric outlet obstruction not completely excluded. Question partial small bowel malrotation with majority of small bowel loops in RIGHT upper quadrant. Multiple uterine leiomyomata. Multiple foci of soft tissue gas and a small amount of fluid are identified both within subcutaneous soft tissues and within muscles anterior to the RIGHT hip prosthesis may represent resolving postsurgical changes but infection not excluded by CT. Aortic Atherosclerosis (ICD10-I70.0). Electronically Signed   By: Lavonia Dana M.D.   On: 08/23/2021 16:42   DG Abd Portable 1V  Result Date: 08/25/2021 CLINICAL DATA:  Gastric dilatation EXAM: PORTABLE ABDOMEN - 1 VIEW COMPARISON:  Abdominal CT from 2 days ago FINDINGS: Normal bowel gas pattern. Enteric contrast is within nondilated colon. No gastric distension today. Lap band in unremarkable position. No concerning mass effect or gas collection. Right hip arthroplasty. IMPRESSION: Normal bowel gas pattern.  Resolved gastric distension. Electronically Signed   By: Jorje Guild M.D.   On: 08/25/2021 07:48   VAS [place] LOWER EXTREMITY VENOUS (DVT)  Result Date: 08/26/2021  Lower Venous DVT Study Patient Name:  DINEEN CONRADT  Date of Exam:   08/25/2021 Medical Rec #: [id number]                Accession #:    [id number] Date of Birth:  18-Jan-1961                 Patient Gender: F Patient Age:   80 years Exam Location:  [facility] Procedure:      VAS [place] LOWER EXTREMITY VENOUS (DVT) Referring Phys: Imagene Sheller --------------------------------------------------------------------------------  Indications: Pain, and Swelling.  Risk Factors: Surgery. Comparison Study: No prior studies. Performing Technologist: Oliver Hum RVT  Examination Guidelines: A complete evaluation includes B-mode imaging, spectral Doppler, color Doppler, and power Doppler as needed of all accessible portions of each vessel. Bilateral testing is  considered an integral part of a complete examination. Limited examinations for reoccurring indications may be performed as noted. The reflux portion of the exam is performed with the patient in reverse Trendelenburg.  +---------+---------------+---------+-----------+----------+--------------+ RIGHT    CompressibilityPhasicitySpontaneityPropertiesThrombus Aging +---------+---------------+---------+-----------+----------+--------------+ CFV      Full           Yes      Yes                                 +---------+---------------+---------+-----------+----------+--------------+ SFJ      Full                                                        +---------+---------------+---------+-----------+----------+--------------+ FV Prox  Full                                                        +---------+---------------+---------+-----------+----------+--------------+ FV Mid   Full                                                        +---------+---------------+---------+-----------+----------+--------------+ FV DistalFull                                                        +---------+---------------+---------+-----------+----------+--------------+ PFV      Full                                                        +---------+---------------+---------+-----------+----------+--------------+ POP      Full           Yes      Yes                                 +---------+---------------+---------+-----------+----------+--------------+ PTV      Full                                                        +---------+---------------+---------+-----------+----------+--------------+  PERO     Full                                                        +---------+---------------+---------+-----------+----------+--------------+   +----+---------------+---------+-----------+----------+--------------+  LEFTCompressibilityPhasicitySpontaneityPropertiesThrombus Aging +----+---------------+---------+-----------+----------+--------------+ CFV Full           Yes      Yes                                 +----+---------------+---------+-----------+----------+--------------+    Summary: RIGHT: - There is no evidence of deep vein thrombosis in the lower extremity.  - No cystic structure found in the popliteal fossa.  LEFT: - No evidence of common femoral vein obstruction.  *See table(s) above for measurements and observations. Electronically signed by Harold Barban MD on 08/26/2021 at 9:27:55 PM.    Final     DISCHARGE EXAMINATION: Vitals:   08/27/21 1258 08/27/21 2007 08/28/21 0428 08/28/21 1231  BP: 132/62 (!) 149/72 (!) 132/59 (!) 148/71  Pulse: 90 78 80 82  Resp:  18 18 20   Temp: 98.3 F (36.8 C) 98.3 F (36.8 C) (!) 97.5 F (36.4 C) 98.1 F (36.7 C)  TempSrc: Oral Oral Oral Oral  SpO2: 100% 100% 100% 100%  Weight:      Height:       General appearance: Awake alert.  In no distress Resp: Clear to auscultation bilaterally.  Normal effort Cardio: S1-S2 is normal regular.  No S3-S4.  No rubs murmurs or bruit GI: Abdomen is soft.  Nontender nondistended.  Bowel sounds are present normal.  No masses organomegaly    DISPOSITION: Home  Discharge Instructions     Call MD for:  difficulty breathing, headache or visual disturbances   Complete by: As directed    Call MD for:  extreme fatigue   Complete by: As directed    Call MD for:  persistant dizziness or light-headedness   Complete by: As directed    Call MD for:  persistant nausea and vomiting   Complete by: As directed    Call MD for:  severe uncontrolled pain   Complete by: As directed    Call MD for:  temperature >100.4   Complete by: As directed    Diet Carb Modified   Complete by: As directed    Discharge instructions   Complete by: As directed    Please take your medications as prescribed.  Follow-up with your  primary care provider.  Monitor your glucose levels. Follow-up with the orthopedic surgeon if you have not done so yet.  You were cared for by a hospitalist during your hospital stay. If you have any questions about your discharge medications or the care you received while you were in the hospital after you are discharged, you can call the unit and asked to speak with the hospitalist on call if the hospitalist that took care of you is not available. Once you are discharged, your primary care physician will handle any further medical issues. Please note that NO REFILLS for any discharge medications will be authorized once you are discharged, as it is imperative that you return to your primary care physician (or establish a relationship with a primary care physician if you do not have one) for your aftercare needs so  that they can reassess your need for medications and monitor your lab values. If you do not have a primary care physician, you can call [phone] for a physician referral.   Increase activity slowly   Complete by: As directed    No wound care   Complete by: As directed           Allergies as of 08/28/2021   No Known Allergies      Medication List     STOP taking these medications    Jardiance 25 MG Tabs tablet Generic drug: empagliflozin   meloxicam 15 MG tablet Commonly known as: MOBIC   MOTRIN IB PO   ondansetron 4 MG tablet Commonly known as: ZOFRAN   traMADol 50 MG tablet Commonly known as: ULTRAM       TAKE these medications    acetaminophen 325 MG tablet Commonly known as: TYLENOL Take 325 mg by mouth every 6 (six) hours as needed.   alum & mag hydroxide-simeth 200-200-20 MG/5ML suspension Commonly known as: MAALOX/MYLANTA Take 30 mLs by mouth every 4 (four) hours as needed for indigestion or heartburn.   aspirin 81 MG chewable tablet Chew 81 mg by mouth 2 (two) times daily. What changed: Another medication with the same name was removed. Continue  taking this medication, and follow the directions you see here.   azelastine 0.05 % ophthalmic solution Commonly known as: OPTIVAR Place 1 drop into both eyes as needed.   azelastine 0.1 % nasal spray Commonly known as: ASTELIN azelastine 137 mcg (0.1 %) nasal spray aerosol  Spray 2 sprays twice a day by intranasal route. What changed: Another medication with the same name was removed. Continue taking this medication, and follow the directions you see here.   calcium-vitamin D 500-200 MG-UNIT tablet Commonly known as: OSCAL WITH D Take 1 tablet by mouth.   cholecalciferol 25 MCG (1000 UNIT) tablet Commonly known as: VITAMIN D3 Take 1,000 Units by mouth daily.   clotrimazole-betamethasone cream Commonly known as: LOTRISONE clotrimazole-betamethasone 1 %-0.05 % topical cream  APPLY TOPICALLY TO THE AFFECTED AND SURROUNDING AREAS TWICE DAILY IN THE MORNING AND IN THE EVENING FOR 7 DAYS   docusate sodium 100 MG capsule Commonly known as: COLACE Take 100 mg by mouth 2 (two) times daily.   famotidine 20 MG tablet Commonly known as: PEPCID Take 1 tablet (20 mg total) by mouth 2 (two) times daily for 14 days.   fluticasone 50 MCG/ACT nasal spray Commonly known as: FLONASE Place 2 sprays into the nose daily.   FreeStyle Libre 14 Day Sensor Misc CHANGE SENSOR EVERY 14 DAYS   insulin aspart 100 UNIT/ML FlexPen Commonly known as: NovoLOG FlexPen Inject 25 Units into the skin 3 (three) times daily with meals. And nano pen needles 3/day What changed:  how much to take additional instructions   levocetirizine 5 MG tablet Commonly known as: XYZAL Take 5 mg by mouth daily.   metoCLOPramide 5 MG tablet Commonly known as: REGLAN Take 1 tablet (5 mg total) by mouth every 8 (eight) hours as needed for refractory nausea / vomiting.   ondansetron 4 MG disintegrating tablet Commonly known as: ZOFRAN-ODT Take 4 mg by mouth every 6 (six) hours as needed.   pantoprazole 40 MG  tablet Commonly known as: PROTONIX Take 1 tablet (40 mg total) by mouth daily.   pseudoephedrine 30 MG tablet Commonly known as: SUDAFED Take 30 mg by mouth every 4 (four) hours as needed for congestion.   senna 8.6 MG  Tabs tablet Commonly known as: SENOKOT Take 2 tablets by mouth at bedtime.   Tyler Aas FlexTouch 100 UNIT/ML FlexTouch Pen Generic drug: insulin degludec 20 Units daily. With breakfast meal per pt   vitamin C 500 MG tablet Commonly known as: ASCORBIC ACID Take 500 mg by mouth daily.   Wegovy 2.4 MG/0.75ML Soaj Generic drug: Semaglutide-Weight Management Inject 2.4 mg into the skin. PT takes on Monday per pt          Follow-up Information     [facility] Follow up.   Specialty: Home Health Services Why: They will contact you to set up a time to come out for your first session. Contact information: [place] [address] [phone]                 TOTAL DISCHARGE TIME: 35 minutes  [facility] Pager on [email or web address]  08/29/2021, 10:47 AM

## 2021-08-28 NOTE — Progress Notes (Signed)
Physical Therapy Treatment Patient Details Name: Felicia Hayden MRN: [id number] DOB: 1961-08-31 Today's Date: 08/28/2021   History of Present Illness Patient is 60 y.o. female presented to the emergency department due to epigastric abdominal pain, nausea and vomiting. CT imaging showed concerns for possible gastric outlet obstruction, as of 10/13 having BMs. 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    PT Comments    Pt making good progress.  Good understanding of THA exercises and progression.  Able to perform gait and stairs with supervision.      Recommendations for follow up therapy are one component of a multi-disciplinary discharge planning process, led by the attending physician.  Recommendations may be updated based on patient status, additional functional criteria and insurance authorization.  Follow Up Recommendations  Supervision for mobility/OOB;Home health PT     Equipment Recommendations  None recommended by PT    Recommendations for Other Services       Precautions / Restrictions Precautions Precautions: Fall Restrictions Other Position/Activity Restrictions: Recent R DA THA WBAT     Mobility  Bed Mobility               General bed mobility comments: in chair    Transfers Overall transfer level: Needs assistance Equipment used: Rolling walker (2 wheeled) Transfers: Sit to/from Stand Sit to Stand: Supervision            Ambulation/Gait Ambulation/Gait assistance: Supervision Gait Distance (Feet): 150 Feet Assistive device: Rolling walker (2 wheeled) Gait Pattern/deviations: Step-through pattern;Decreased stride length     General Gait Details: Min cues for posture; limited distance due to lunch arrived   Stairs Stairs: Yes Stairs assistance: Supervision Stair Management: One rail Left;Step to pattern;Sideways Number of Stairs: 5 General stair comments: up/down 5 steps with close  supervision   Wheelchair Mobility    Modified Rankin (Stroke Patients Only)       Balance Overall balance assessment: Needs assistance Sitting-balance support: No upper extremity supported;Feet supported Sitting balance-Leahy Scale: Good     Standing balance support: No upper extremity supported;Bilateral upper extremity supported Standing balance-Leahy Scale: Fair Standing balance comment: RW to ambulate but could static stand                            Cognition Arousal/Alertness: Awake/alert Behavior During Therapy: WFL for tasks assessed/performed Overall Cognitive Status: Within Functional Limits for tasks assessed                                        Exercises      General Comments        Pertinent Vitals/Pain Pain Assessment: No/denies pain    Home Living                      Prior Function            PT Goals (current goals can now be found in the care plan section) Progress towards PT goals: Progressing toward goals    Frequency    Min 4X/week      PT Plan Current plan remains appropriate    Co-evaluation              AM-PAC PT "6 Clicks" Mobility   Outcome Measure  Help needed turning from your back to your side while in  a flat bed without using bedrails?: None Help needed moving from lying on your back to sitting on the side of a flat bed without using bedrails?: None Help needed moving to and from a bed to a chair (including a wheelchair)?: A Little Help needed standing up from a chair using your arms (e.g., wheelchair or bedside chair)?: A Little Help needed to walk in hospital room?: A Little Help needed climbing 3-5 steps with a railing? : A Little 6 Click Score: 20    End of Session Equipment Utilized During Treatment: Gait belt Activity Tolerance: Patient tolerated treatment well Patient left: with call bell/phone within reach;in chair Nurse Communication: Mobility status PT Visit  Diagnosis: Other abnormalities of gait and mobility (R26.89);Muscle weakness (generalized) (M62.81)     Time: 1240-1250 PT Time Calculation (min) (ACUTE ONLY): 10 min  Charges:  $Gait Training: 8-22 mins                     Abran Richard, PT Acute Rehab Services Pager [phone] [facility] [place] 08/28/2021, 1:10 PM

## 2021-09-17 ENCOUNTER — Ambulatory Visit: Payer: BC Managed Care – PPO | Admitting: Neurology

## 2022-02-04 ENCOUNTER — Ambulatory Visit: Payer: BC Managed Care – PPO | Admitting: Adult Health

## 2022-08-14 IMAGING — DX DG CHEST 2V
2 series · 2 of 2 positions shown · non-contrast
Comparison: None.

CLINICAL DATA: Preop evaluation for upcoming bariatric surgery

EXAM:
CHEST - 2 VIEW

[chest pa]
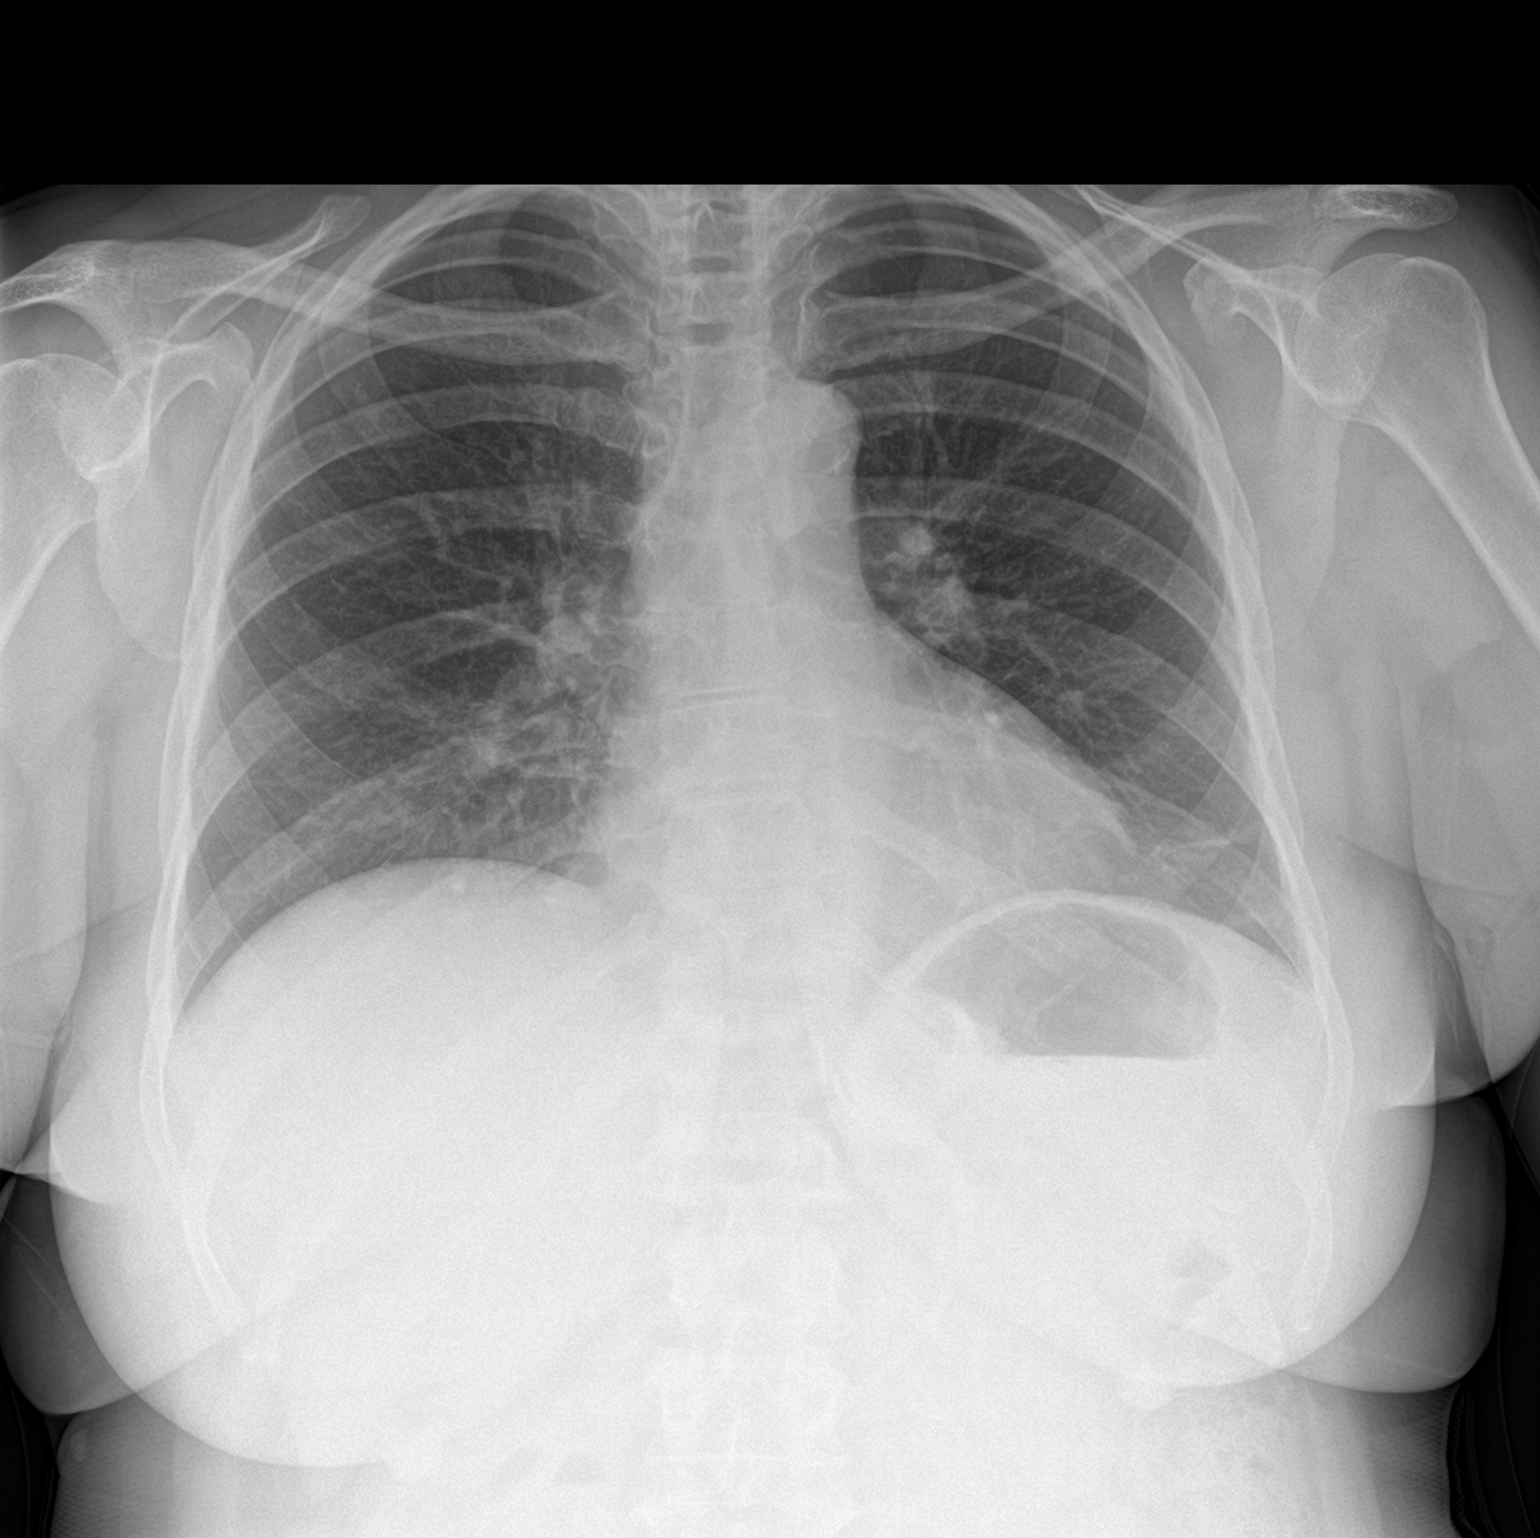

[chest lat]
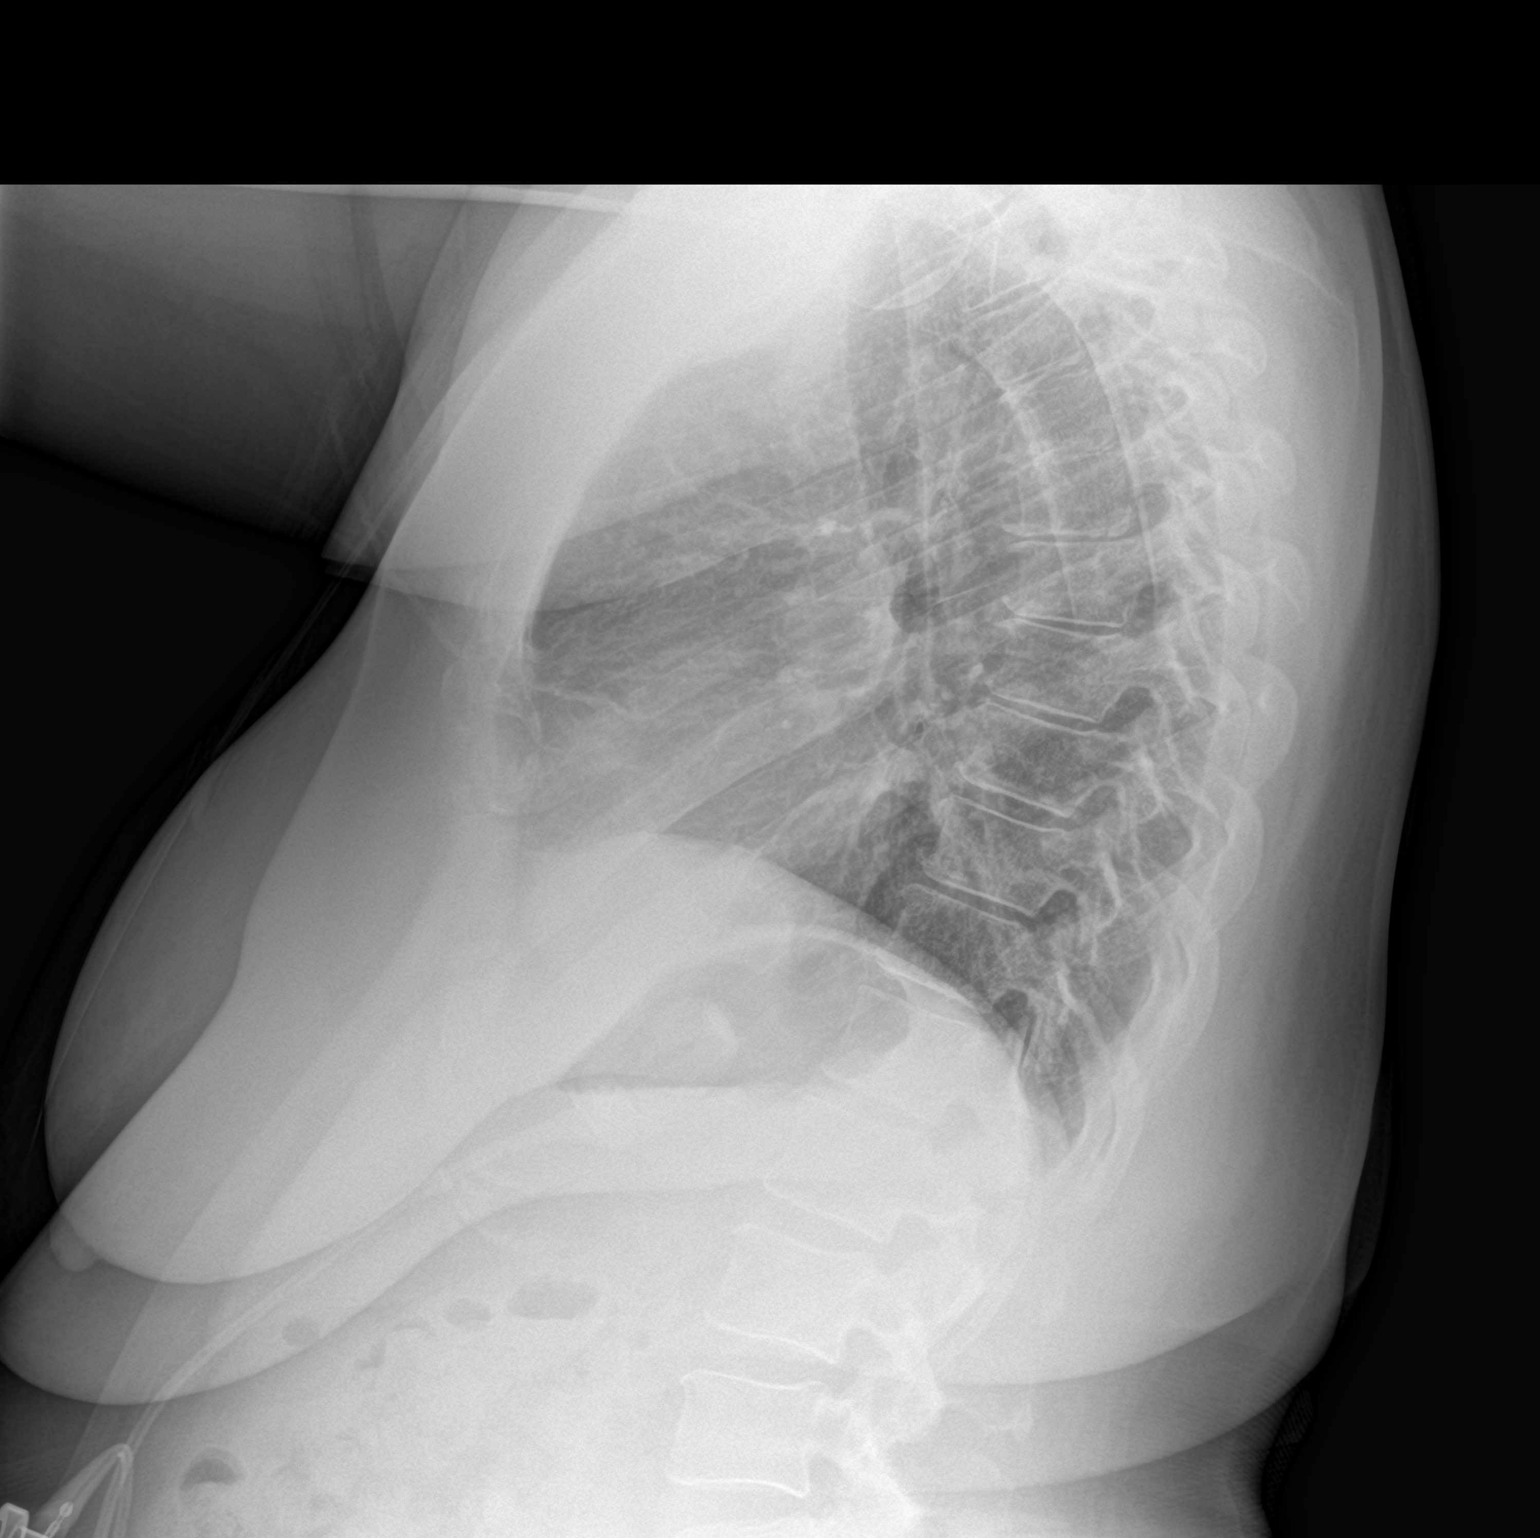

[2 of 2 positions shown; findings below may reference images not displayed]

FINDINGS: The heart size and mediastinal contours are within normal limits.
Both lungs are clear. The visualized skeletal structures are
unremarkable.
IMPRESSION: No active cardiopulmonary disease.

## 2022-10-01 IMAGING — MR MR HIP*R* W/O CM
5 series · 35 of 40 positions shown · non-contrast
Comparison: Plain films right hip 07/11/2014.

CLINICAL DATA: Right hip and groin pain for 3 years. No known
injury.

EXAM:
MR OF THE RIGHT HIP WITHOUT CONTRAST
TECHNIQUE: Multiplanar, multisequence MR imaging was performed. No intravenous
contrast was administered.

[Series 9: T2 fat-sat · coronal · right · 3.0mm · 0.89mm/px · 9 of 35 slices shown (1 of 2)]
[im 1/35]
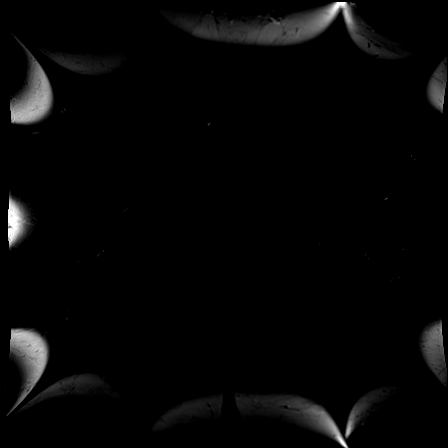
[im 5/35]
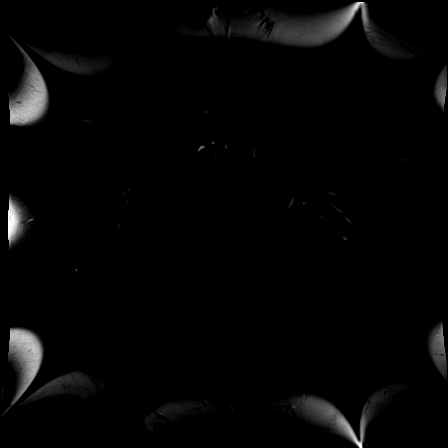
[im 9/35]
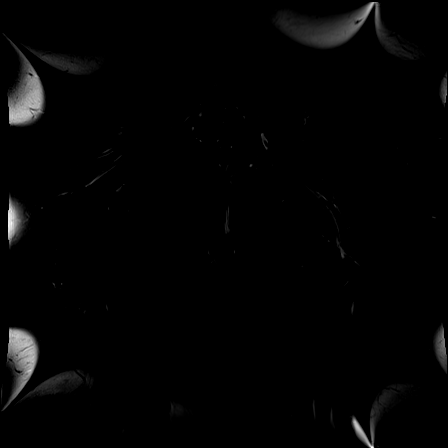
[im 13/35]
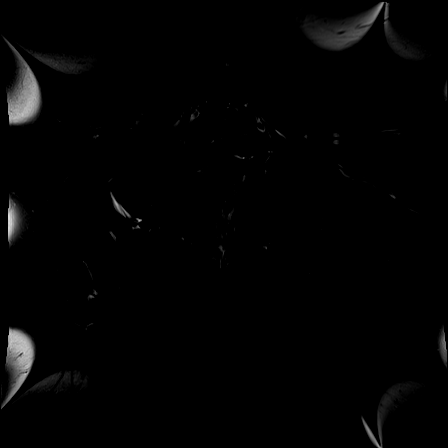
[im 18/35]
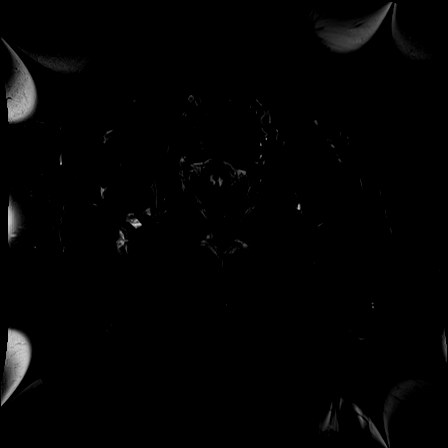
[im 22/35]
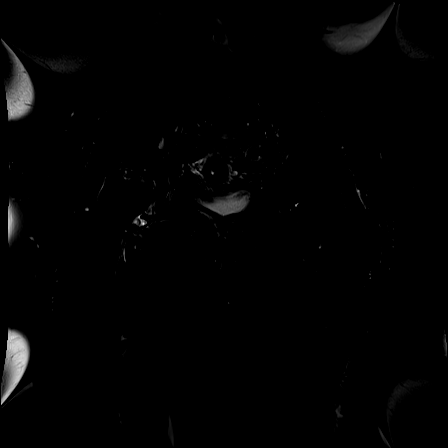
[im 26/35]
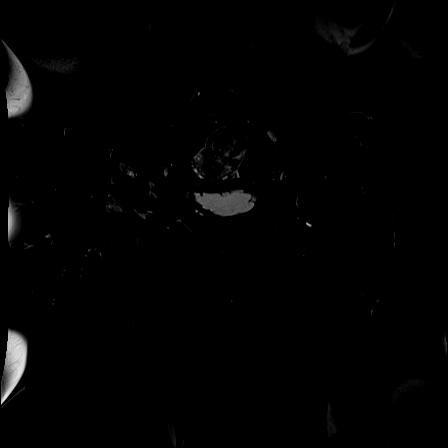
[im 30/35]
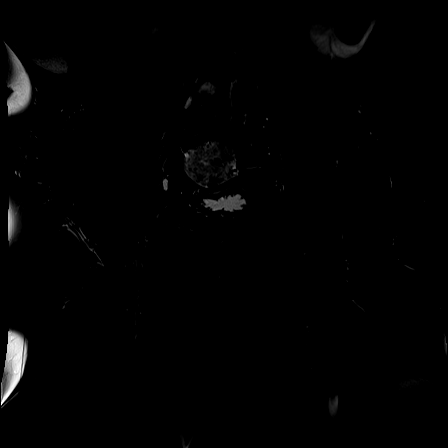
[im 35/35]
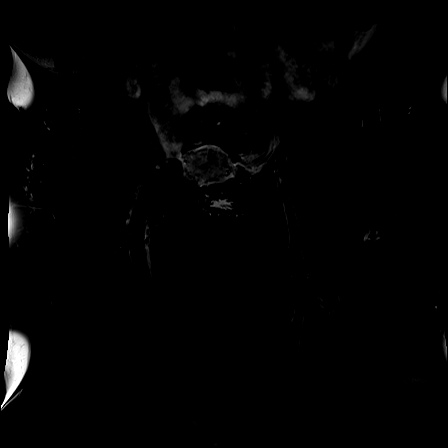

[Series 10: T1 · coronal · right · 3.0mm · 0.89mm/px · 3 of 35 slices shown]
[im 1/35]
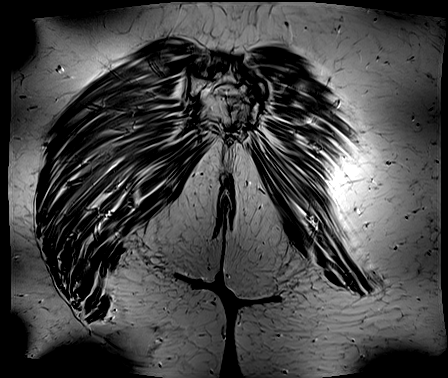
[im 5/35]
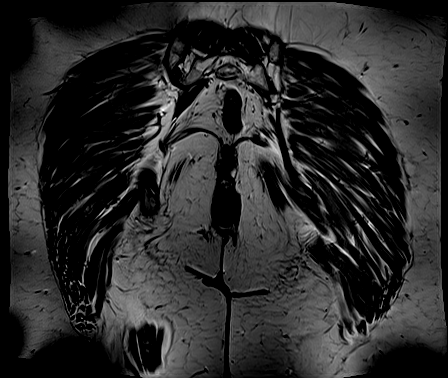
[im 10/35]
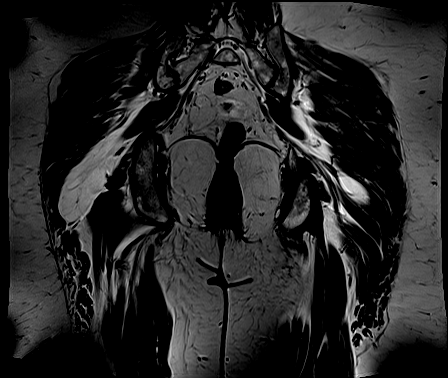

[Series 11: T2 fat-sat · axial · right · 3.0mm · 1.16mm/px · z∈[-20,+91]mm · 8 of 32 slices shown (2 of 2)]
[im 1/32]
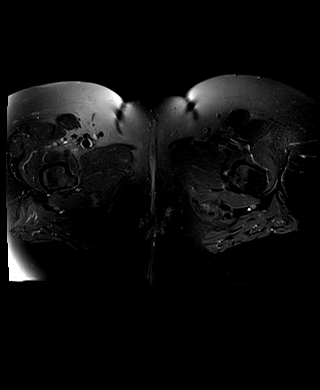
[im 5/32]
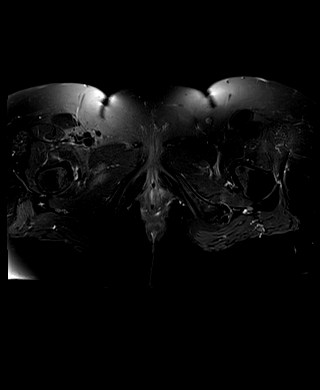
[im 9/32]
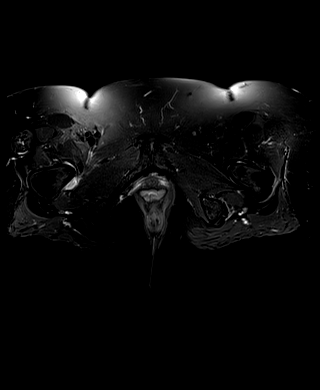
[im 14/32]
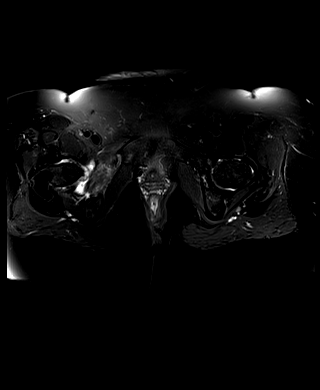
[im 18/32]
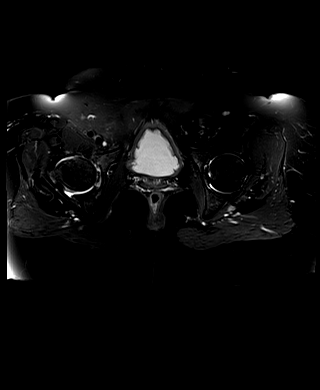
[im 23/32]
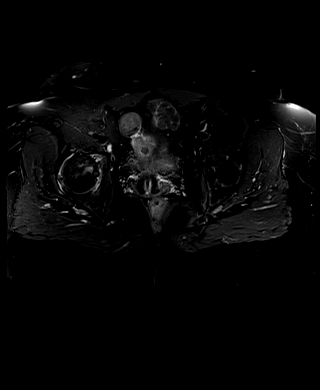
[im 27/32]
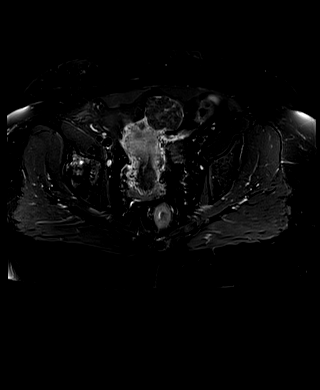
[im 32/32]
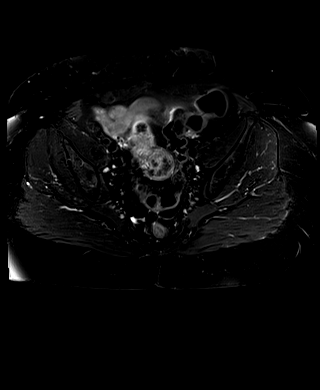

[Series 12: PD fat-sat · coronal · right · 3.0mm · 0.56mm/px · 7 of 30 slices shown (1 of 2)]
[im 1/30]
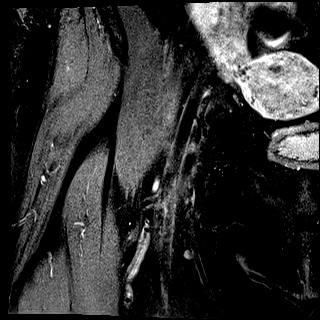
[im 5/30]
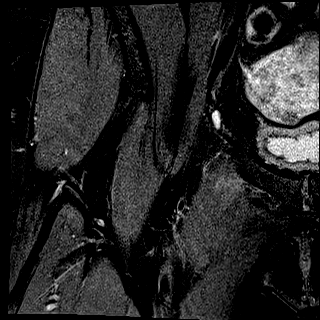
[im 10/30]
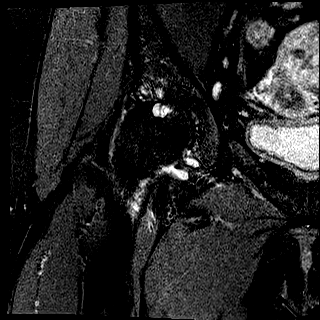
[im 15/30]
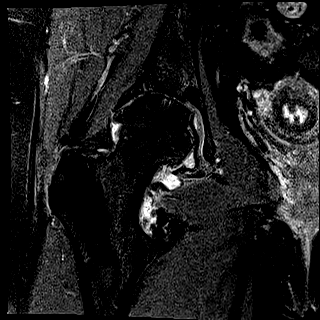
[im 20/30]
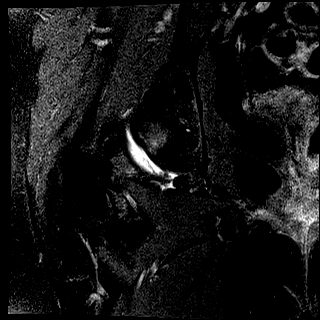
[im 25/30]
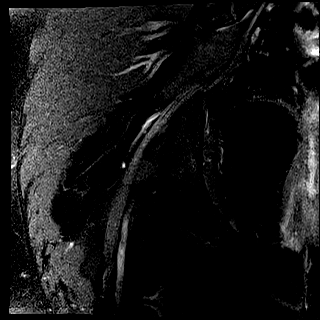
[im 30/30]
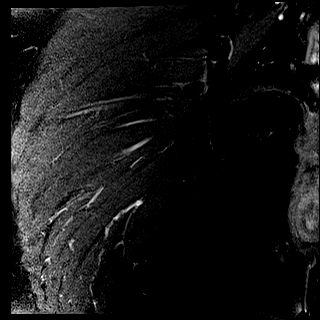

[Series 13: PD fat-sat · sagittal · right · 3.0mm · 0.56mm/px · 8 of 35 slices shown (2 of 2)]
[im 1/35]
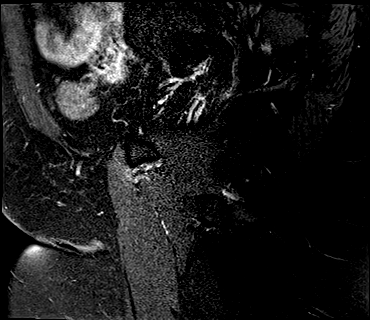
[im 5/35]
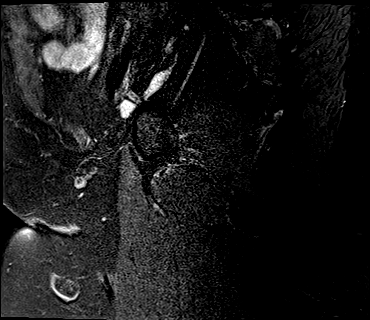
[im 10/35]
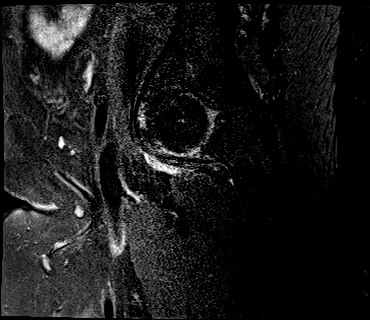
[im 15/35]
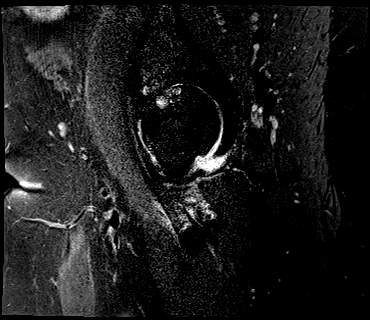
[im 20/35]
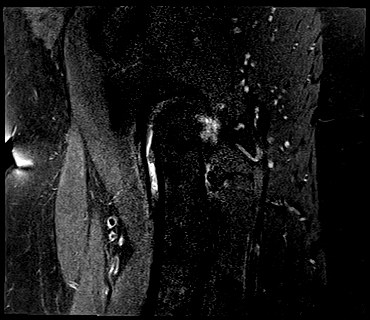
[im 25/35]
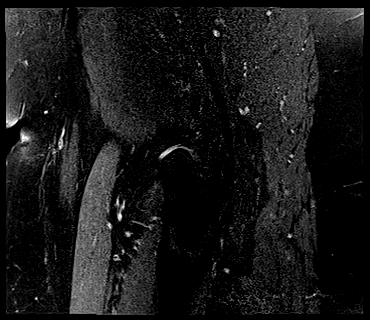
[im 30/35]
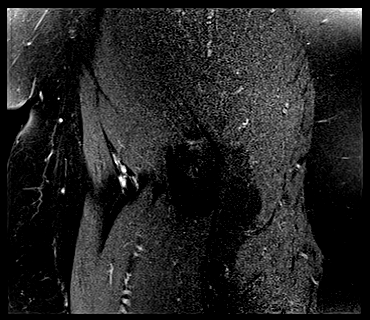
[im 35/35]
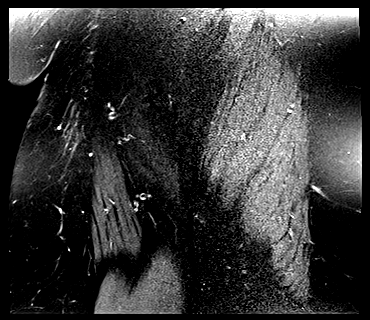

[35 of 40 positions shown; findings below may reference images not displayed]

FINDINGS: Bones: No fracture, stress change or worrisome lesion is identified.
No avascular necrosis of the femoral heads. Subchondral cyst
formation, edema and osteophytosis are seen about the right hip.

Articular cartilage and labrum

Articular cartilage: Completely denuded with bone-on-bone joint
space narrowing.

Labrum:  Degenerated without focal tear identified.

Joint or bursal effusion

Joint effusion:  Small joint effusion.

Bursae: Negative.

Muscles and tendons

Muscles and tendons:  Intact.

Other findings

Miscellaneous: Imaged intrapelvic contents demonstrate no acute
abnormality. Uterine fibroid noted.
IMPRESSION: Advanced right hip osteoarthritis.

Uterine fibroid.

## 2022-10-30 ENCOUNTER — Emergency Department (HOSPITAL_BASED_OUTPATIENT_CLINIC_OR_DEPARTMENT_OTHER)
Admission: EM | Admit: 2022-10-30 | Discharge: 2022-10-30 | Disposition: A | Discharge status: Home / Self Care | Payer: BC Managed Care – PPO | Attending: Emergency Medicine | Admitting: Emergency Medicine

## 2022-10-30 ENCOUNTER — Emergency Department (HOSPITAL_BASED_OUTPATIENT_CLINIC_OR_DEPARTMENT_OTHER): Payer: BC Managed Care – PPO | Admitting: Radiology

## 2022-10-30 ENCOUNTER — Encounter (HOSPITAL_BASED_OUTPATIENT_CLINIC_OR_DEPARTMENT_OTHER): Payer: Self-pay

## 2022-10-30 ENCOUNTER — Other Ambulatory Visit: Payer: Self-pay

## 2022-10-30 DIAGNOSIS — Z7984 Long term (current) use of oral hypoglycemic drugs: Secondary | ICD-10-CM | POA: Insufficient documentation

## 2022-10-30 DIAGNOSIS — R824 Acetonuria: Secondary | ICD-10-CM | POA: Insufficient documentation

## 2022-10-30 DIAGNOSIS — R002 Palpitations: Secondary | ICD-10-CM | POA: Insufficient documentation

## 2022-10-30 DIAGNOSIS — Z7982 Long term (current) use of aspirin: Secondary | ICD-10-CM | POA: Insufficient documentation

## 2022-10-30 DIAGNOSIS — M5412 Radiculopathy, cervical region: Secondary | ICD-10-CM | POA: Insufficient documentation

## 2022-10-30 DIAGNOSIS — M5410 Radiculopathy, site unspecified: Secondary | ICD-10-CM

## 2022-10-30 DIAGNOSIS — E1065 Type 1 diabetes mellitus with hyperglycemia: Secondary | ICD-10-CM | POA: Insufficient documentation

## 2022-10-30 DIAGNOSIS — Z794 Long term (current) use of insulin: Secondary | ICD-10-CM | POA: Insufficient documentation

## 2022-10-30 DIAGNOSIS — R739 Hyperglycemia, unspecified: Secondary | ICD-10-CM

## 2022-10-30 LAB — COMPREHENSIVE METABOLIC PANEL
ALT: 14 U/L (ref 0–44)
AST: 15 U/L (ref 15–41)
Albumin: 4.7 g/dL (ref 3.5–5.0)
Alkaline Phosphatase: 55 U/L (ref 38–126)
Anion gap: 14 (ref 5–15)
BUN: 36 mg/dL — ABNORMAL HIGH (ref 8–23)
CO2: 20 mmol/L — ABNORMAL LOW (ref 22–32)
Calcium: 9.9 mg/dL (ref 8.9–10.3)
Chloride: 102 mmol/L (ref 98–111)
Creatinine, Ser: 0.87 mg/dL (ref 0.44–1.00)
GFR, Estimated: >60 mL/min (ref >60)
Glucose, Bld: 214 mg/dL — ABNORMAL HIGH (ref 70–99)
Potassium: 4.1 mmol/L (ref 3.5–5.1)
Sodium: 136 mmol/L (ref 135–145)
Total Bilirubin: 0.6 mg/dL (ref 0.3–1.2)
Total Protein: 7.7 g/dL (ref 6.5–8.1)

## 2022-10-30 LAB — CBC
HCT: 45.9 % (ref 36.0–46.0)
Hemoglobin: 14.4 g/dL (ref 12.0–15.0)
MCH: 26.4 pg (ref 26.0–34.0)
MCHC: 31.4 g/dL (ref 30.0–36.0)
MCV: 84.1 fL (ref 80.0–100.0)
Platelets: 306 10*3/uL (ref 150–400)
RBC: 5.46 MIL/uL — ABNORMAL HIGH (ref 3.87–5.11)
RDW: 13.4 % (ref 11.5–15.5)
WBC: 9.4 10*3/uL (ref 4.0–10.5)
nRBC: 0 % (ref 0.0–0.2)

## 2022-10-30 LAB — I-STAT VENOUS BLOOD GAS, ED
Acid-base deficit: 1 mmol/L (ref 0.0–2.0)
Bicarbonate: 23.5 mmol/L (ref 20.0–28.0)
Calcium, Ion: 1.18 mmol/L (ref 1.15–1.40)
HCT: 45 % (ref 36.0–46.0)
Hemoglobin: 15.3 g/dL — ABNORMAL HIGH (ref 12.0–15.0)
O2 Saturation: 70 %
Patient temperature: 98.6
Potassium: 5 mmol/L (ref 3.5–5.1)
Sodium: 136 mmol/L (ref 135–145)
TCO2: 25 mmol/L (ref 22–32)
pCO2, Ven: 39.2 mmHg — ABNORMAL LOW (ref 44–60)
pH, Ven: 7.385 (ref 7.25–7.43)
pO2, Ven: 37 mmHg (ref 32–45)

## 2022-10-30 LAB — URINALYSIS, ROUTINE W REFLEX MICROSCOPIC
Bilirubin Urine: NEGATIVE
Glucose, UA: >1000 mg/dL — AB
Hgb urine dipstick: NEGATIVE
Ketones, ur: 40 mg/dL — AB
Leukocytes,Ua: NEGATIVE
Nitrite: NEGATIVE
Protein, ur: NEGATIVE mg/dL
Specific Gravity, Urine: 1.039 — ABNORMAL HIGH (ref 1.005–1.030)
pH: 5.5 (ref 5.0–8.0)

## 2022-10-30 LAB — TROPONIN I (HIGH SENSITIVITY)
Troponin I (High Sensitivity): 4 ng/L (ref <18)
Troponin I (High Sensitivity): 7 ng/L (ref <18)

## 2022-10-30 LAB — CBG MONITORING, ED: Glucose-Capillary: 200 mg/dL — ABNORMAL HIGH (ref 70–99)

## 2022-10-30 LAB — BETA-HYDROXYBUTYRIC ACID: Beta-Hydroxybutyric Acid: 1.02 mmol/L — ABNORMAL HIGH (ref 0.05–0.27)

## 2022-10-30 MED ORDER — LIDOCAINE 5 % EX PTCH
1.0000 | MEDICATED_PATCH | CUTANEOUS | Status: DC
  Administered 2022-10-30: 1 via TRANSDERMAL
  Filled 2022-10-30: qty 1

## 2022-10-30 MED ORDER — LIDOCAINE 5 % EX PTCH: 1.0000 | MEDICATED_PATCH | CUTANEOUS | 0 refills | Status: DC

## 2022-10-30 MED ORDER — OXYCODONE HCL 5 MG PO TABS: 5.0000 mg | ORAL_TABLET | ORAL | 0 refills | Status: DC | PRN

## 2022-10-30 MED ORDER — LACTATED RINGERS IV BOLUS
1000.0000 mL | Freq: Once | INTRAVENOUS | Status: AC
  Administered 2022-10-30: 1000 mL via INTRAVENOUS

## 2022-10-30 MED ORDER — KETOROLAC TROMETHAMINE 15 MG/ML IJ SOLN
15.0000 mg | Freq: Once | INTRAMUSCULAR | Status: AC
  Administered 2022-10-30: 15 mg via INTRAVENOUS
  Filled 2022-10-30: qty 1

## 2022-10-30 MED ORDER — LIDOCAINE 5 % EX PTCH: 1.0000 | MEDICATED_PATCH | CUTANEOUS | 0 refills | Status: AC

## 2022-10-30 MED ORDER — OXYCODONE HCL 5 MG PO TABS
5.0000 mg | ORAL_TABLET | ORAL | Status: AC
  Administered 2022-10-30: 5 mg via ORAL
  Filled 2022-10-30: qty 1

## 2022-10-30 NOTE — ED Notes (Signed)
Reviewed AVS/discharge instruction with patient. Time allotted for and all questions answered. Patient is agreeable for d/c and escorted to ed exit by staff.  

## 2022-10-30 NOTE — ED Notes (Signed)
RT note: VBG obtained by RN, ran on ISTAT-resulted.

## 2022-10-30 NOTE — Discharge Instructions (Signed)
You were seen in the emergency department for your arm pain and numbness.  It is likely that you have a pinched nerve in your neck.  Please follow-up with neurosurgery soon as possible regarding this.  You may take Tylenol, ibuprofen, lidocaine patches, and oxycodone for any breakthrough pain that you have.  You were also evaluated for diabetic ketoacidosis and had lab work that was reassuring.  You did have ketones in your urine but were not found to be acidotic.  Please follow-up with your primary doctor as well as your endocrinologist regarding your diabetes medications.  Stop taking the prednisone at this time as this could be worsening your symptoms.  Return immediately to the emergency department if you experience any of the following: Weakness of your arm, shortness of breath, increased thirst or urination, or any other concerning symptoms.

## 2022-10-30 NOTE — ED Provider Notes (Signed)
[place] [facility] DEPT Provider Note   CSN: [id number] Arrival date & time: 10/30/22  1145     History {Add pertinent medical, surgical, social history, OB history to HPI:1} Chief Complaint  Patient presents with   Palpitations    Felicia Hayden is a 61 y.o. female.    Past several days palp intermit 12/2 pain Steroids - pred and shot for shoulder wed concerned about possible dka so checked urine with + ketones and referred into the ED by endocrinologist. Has been eating and drinking. Takes treceba 20 u daily, jardiance 15 mg daily, novolog sliding scale, wegovy once a week L shoulder pain dull aching radiating down L arm, numbn, no sig weak, not dropping things       Home Medications Prior to Admission medications   Medication Sig Start Date End Date Taking? Authorizing Provider  acetaminophen (TYLENOL) 325 MG tablet Take 325 mg by mouth every 6 (six) hours as needed.    [provider]  alum & mag hydroxide-simeth (MAALOX/MYLANTA) 200-200-20 MG/5ML suspension Take 30 mLs by mouth every 4 (four) hours as needed for indigestion or heartburn. 08/28/21   Bonnielee Haff, MD  aspirin 81 MG chewable tablet Chew 81 mg by mouth 2 (two) times daily.    [provider]  azelastine (ASTELIN) 0.1 % nasal spray azelastine 137 mcg (0.1 %) nasal spray aerosol  Spray 2 sprays twice a day by intranasal route.    [provider]  azelastine (OPTIVAR) 0.05 % ophthalmic solution Place 1 drop into both eyes as needed. 04/12/18   [provider]  calcium-vitamin D (OSCAL WITH D) 500-200 MG-UNIT per tablet Take 1 tablet by mouth.    [provider]  cholecalciferol (VITAMIN D3) 25 MCG (1000 UNIT) tablet Take 1,000 Units by mouth daily.    [provider]  clotrimazole-betamethasone (LOTRISONE) cream clotrimazole-betamethasone 1 %-0.05 % topical cream  APPLY TOPICALLY TO THE AFFECTED AND SURROUNDING AREAS TWICE DAILY IN  THE MORNING AND IN THE EVENING FOR 7 DAYS    [provider]  Continuous Blood Gluc Sensor (FREESTYLE LIBRE 14 DAY SENSOR) MISC CHANGE SENSOR EVERY 14 DAYS 06/25/21   [provider]  docusate sodium (COLACE) 100 MG capsule Take 100 mg by mouth 2 (two) times daily. 08/04/21   [provider]  famotidine (PEPCID) 20 MG tablet Take 1 tablet (20 mg total) by mouth 2 (two) times daily for 14 days. 08/28/21 09/11/21  Bonnielee Haff, MD  fluticasone (FLONASE) 50 MCG/ACT nasal spray Place 2 sprays into the nose daily.    [provider]  insulin aspart (NOVOLOG FLEXPEN) 100 UNIT/ML FlexPen Inject 25 Units into the skin 3 (three) times daily with meals. And nano pen needles 3/day Patient taking differently: Inject into the skin 3 (three) times daily with meals. And nano pen needles 3/day Insulin is based on sliding scale and carb based per pt 12/02/17   Renato Shin, MD  levocetirizine (XYZAL) 5 MG tablet Take 5 mg by mouth daily. 04/21/21   [provider]  metoCLOPramide (REGLAN) 5 MG tablet Take 1 tablet (5 mg total) by mouth every 8 (eight) hours as needed for refractory nausea / vomiting. 08/28/21   Bonnielee Haff, MD  ondansetron (ZOFRAN-ODT) 4 MG disintegrating tablet Take 4 mg by mouth every 6 (six) hours as needed. 08/22/21   [provider]  pantoprazole (PROTONIX) 40 MG tablet Take 1 tablet (40 mg total) by mouth daily. 08/29/21 10/28/21  Bonnielee Haff, MD  pseudoephedrine (  SUDAFED) 30 MG tablet Take 30 mg by mouth every 4 (four) hours as needed for congestion.     [provider]  Semaglutide-Weight Management (WEGOVY) 2.4 MG/0.75ML SOAJ Inject 2.4 mg into the skin. PT takes on Monday per pt    [provider]  senna (SENOKOT) 8.6 MG TABS tablet Take 2 tablets by mouth at bedtime. 08/04/21   [provider]  TRESIBA FLEXTOUCH 100 UNIT/ML FlexTouch Pen 20 Units daily. With breakfast meal per pt 01/26/20   [provider]  vitamin C (ASCORBIC ACID) 500 MG tablet Take 500 mg by mouth daily.    [provider]      Allergies    Patient has no known allergies.    Review of Systems   Review of Systems  Physical Exam Updated Vital Signs BP (!) 150/76   Pulse 76   Temp 98 F (36.7 C)   Resp (!) 22   Ht '5\' 6"'$  (1.676 m)   Wt 90.7 kg   LMP 12/04/2014 (Exact Date)   SpO2 100%   BMI 32.28 kg/m  Physical Exam  ED Results / Procedures / Treatments   Labs (all labs ordered are listed, but only abnormal results are displayed) Labs Reviewed  CBC - Abnormal; Notable for the following components:      Result Value   RBC 5.46 (*)    All other components within normal limits  URINALYSIS, ROUTINE W REFLEX MICROSCOPIC - Abnormal; Notable for the following components:   Specific Gravity, Urine 1.039 (*)    Glucose, UA >1,000 (*)    Ketones, ur 40 (*)    All other components within normal limits  COMPREHENSIVE METABOLIC PANEL - Abnormal; Notable for the following components:   CO2 20 (*)    Glucose, Bld 214 (*)    BUN 36 (*)    All other components within normal limits  CBG MONITORING, ED - Abnormal; Notable for the following components:   Glucose-Capillary 200 (*)    All other components within normal limits  TROPONIN I (HIGH SENSITIVITY)  TROPONIN I (HIGH SENSITIVITY)    EKG EKG Interpretation  Date/Time:  Saturday October 30 2022 11:55:45 EST Ventricular Rate:  86 PR Interval:  150 QRS Duration: 84 QT Interval:  326 QTC Calculation: 390 R Axis:   74 Text Interpretation: Normal sinus rhythm normal When compared with ECG of 23-Aug-2021 16:11, PREVIOUS ECG IS PRESENT rate contraolled otherwise similar to previous Confirmed by Charlesetta Shanks [phone]) on 10/30/2022 2:15:11 PM  Radiology DG Chest 2 View  Result Date: 10/30/2022 CLINICAL DATA:  Elevated ketones. EXAM: CHEST - 2 VIEW COMPARISON:  Chest radiograph 02/05/2021 FINDINGS: The heart size and mediastinal contours  are within normal limits. Both lungs are clear. No pleural effusion or pneumothorax. Multilevel degenerative changes in the mid to distal thoracic spine. IMPRESSION: No acute cardiopulmonary abnormality. Electronically Signed   By: Ileana Roup M.D.   On: 10/30/2022 13:32    Procedures Procedures  {Document cardiac monitor, telemetry assessment procedure when appropriate:1}  Medications Ordered in ED Medications - No data to display  ED Course/ Medical Decision Making/ A&P                           Medical Decision Making Amount and/or Complexity of Data Reviewed Labs: ordered. Radiology: ordered.  Risk Prescription drug management.   ***  {Document critical care time when appropriate:1} {Document review of labs and clinical decision tools ie  heart score, Chads2Vasc2 etc:1}  {Document your independent review of radiology images, and any outside records:1} {Document your discussion with family members, caretakers, and with consultants:1} {Document social determinants of health affecting pt's care:1} {Document your decision making why or why not admission, treatments were needed:1} Final Clinical Impression(s) / ED Diagnoses Final diagnoses:  None    Rx / DC Orders ED Discharge Orders     None

## 2022-10-30 NOTE — ED Triage Notes (Signed)
Pt states on Thursday saw ortho for left shoulder pinched nerve. Steroids given at that time.  Pt states when she takes steroids, she is advised to check her ketones. Pt states today her ketones were in the "high range" before coming in today. Pt states that she feels nauseated and like her heart is racing.  Pt also adds that she is still having pain down her arm. Denies cough, fever.

## 2022-11-16 ENCOUNTER — Encounter (HOSPITAL_COMMUNITY): Payer: Self-pay

## 2022-11-16 ENCOUNTER — Other Ambulatory Visit: Payer: Self-pay

## 2022-11-16 ENCOUNTER — Encounter (HOSPITAL_BASED_OUTPATIENT_CLINIC_OR_DEPARTMENT_OTHER): Payer: Self-pay | Admitting: Emergency Medicine

## 2022-11-16 ENCOUNTER — Emergency Department (HOSPITAL_BASED_OUTPATIENT_CLINIC_OR_DEPARTMENT_OTHER)
Admission: EM | Admit: 2022-11-16 | Discharge: 2022-11-16 | Disposition: A | Discharge status: Home / Self Care | Payer: BC Managed Care – PPO | Attending: Emergency Medicine | Admitting: Emergency Medicine

## 2022-11-16 ENCOUNTER — Emergency Department (HOSPITAL_BASED_OUTPATIENT_CLINIC_OR_DEPARTMENT_OTHER): Payer: BC Managed Care – PPO | Admitting: Radiology

## 2022-11-16 DIAGNOSIS — R0789 Other chest pain: Secondary | ICD-10-CM | POA: Diagnosis not present

## 2022-11-16 DIAGNOSIS — Z7982 Long term (current) use of aspirin: Secondary | ICD-10-CM | POA: Diagnosis not present

## 2022-11-16 DIAGNOSIS — R079 Chest pain, unspecified: Secondary | ICD-10-CM | POA: Diagnosis present

## 2022-11-16 DIAGNOSIS — R7989 Other specified abnormal findings of blood chemistry: Secondary | ICD-10-CM | POA: Insufficient documentation

## 2022-11-16 HISTORY — DX: Mononeuropathy, unspecified: G58.9

## 2022-11-16 LAB — CBC
HCT: 44.5 % (ref 36.0–46.0)
Hemoglobin: 13.9 g/dL (ref 12.0–15.0)
MCH: 26.4 pg (ref 26.0–34.0)
MCHC: 31.2 g/dL (ref 30.0–36.0)
MCV: 84.6 fL (ref 80.0–100.0)
Platelets: 236 10*3/uL (ref 150–400)
RBC: 5.26 MIL/uL — ABNORMAL HIGH (ref 3.87–5.11)
RDW: 13.6 % (ref 11.5–15.5)
WBC: 5.2 10*3/uL (ref 4.0–10.5)
nRBC: 0 % (ref 0.0–0.2)

## 2022-11-16 LAB — BASIC METABOLIC PANEL
Anion gap: 16 — ABNORMAL HIGH (ref 5–15)
BUN: 14 mg/dL (ref 8–23)
CO2: 20 mmol/L — ABNORMAL LOW (ref 22–32)
Calcium: 10.7 mg/dL — ABNORMAL HIGH (ref 8.9–10.3)
Chloride: 102 mmol/L (ref 98–111)
Creatinine, Ser: 0.81 mg/dL (ref 0.44–1.00)
GFR, Estimated: >60 mL/min (ref >60)
Glucose, Bld: 219 mg/dL — ABNORMAL HIGH (ref 70–99)
Potassium: 5 mmol/L (ref 3.5–5.1)
Sodium: 138 mmol/L (ref 135–145)

## 2022-11-16 LAB — CBG MONITORING, ED: Glucose-Capillary: 179 mg/dL — ABNORMAL HIGH (ref 70–99)

## 2022-11-16 LAB — TROPONIN I (HIGH SENSITIVITY)
Troponin I (High Sensitivity): 4 ng/L (ref <18)
Troponin I (High Sensitivity): 15 ng/L (ref <18)
Troponin I (High Sensitivity): 19 ng/L — ABNORMAL HIGH (ref <18)
Troponin I (High Sensitivity): 9 ng/L (ref <18)

## 2022-11-16 MED ORDER — ONDANSETRON HCL 4 MG/2ML IJ SOLN
4.0000 mg | Freq: Once | INTRAMUSCULAR | Status: AC
  Administered 2022-11-16: 4 mg via INTRAVENOUS
  Filled 2022-11-16: qty 2

## 2022-11-16 MED ORDER — ONDANSETRON 4 MG PO TBDP
4.0000 mg | ORAL_TABLET | Freq: Once | ORAL | Status: AC
  Administered 2022-11-16: 4 mg via ORAL
  Filled 2022-11-16: qty 1

## 2022-11-16 MED ORDER — METOCLOPRAMIDE HCL 5 MG/ML IJ SOLN
10.0000 mg | Freq: Once | INTRAMUSCULAR | Status: AC
  Administered 2022-11-16: 10 mg via INTRAVENOUS
  Filled 2022-11-16: qty 2

## 2022-11-16 MED ORDER — HYDROMORPHONE HCL 1 MG/ML IJ SOLN
1.0000 mg | Freq: Once | INTRAMUSCULAR | Status: AC
  Administered 2022-11-16: 1 mg via INTRAVENOUS
  Filled 2022-11-16: qty 1

## 2022-11-16 MED ORDER — MORPHINE SULFATE (PF) 4 MG/ML IV SOLN
4.0000 mg | Freq: Once | INTRAVENOUS | Status: AC
  Administered 2022-11-16: 4 mg via INTRAVENOUS
  Filled 2022-11-16: qty 1

## 2022-11-16 MED ORDER — LIDOCAINE 5 % EX PTCH
1.0000 | MEDICATED_PATCH | CUTANEOUS | Status: DC
  Administered 2022-11-16: 1 via TRANSDERMAL
  Filled 2022-11-16: qty 1

## 2022-11-16 MED ORDER — LIDOCAINE VISCOUS HCL 2 % MT SOLN
15.0000 mL | Freq: Once | OROMUCOSAL | Status: DC
  Filled 2022-11-16: qty 15

## 2022-11-16 MED ORDER — KETOROLAC TROMETHAMINE 30 MG/ML IJ SOLN
30.0000 mg | Freq: Once | INTRAMUSCULAR | Status: AC
  Administered 2022-11-16: 30 mg via INTRAVENOUS
  Filled 2022-11-16: qty 1

## 2022-11-16 MED ORDER — FAMOTIDINE IN NACL 20-0.9 MG/50ML-% IV SOLN
20.0000 mg | Freq: Once | INTRAVENOUS | Status: AC
  Administered 2022-11-16: 20 mg via INTRAVENOUS
  Filled 2022-11-16: qty 50

## 2022-11-16 MED ORDER — OXYCODONE-ACETAMINOPHEN 5-325 MG PO TABS
1.0000 | ORAL_TABLET | ORAL | Status: AC | PRN
  Administered 2022-11-16 (×2): 1 via ORAL
  Filled 2022-11-16 (×3): qty 1

## 2022-11-16 MED ORDER — ALUM & MAG HYDROXIDE-SIMETH 200-200-20 MG/5ML PO SUSP
30.0000 mL | Freq: Once | ORAL | Status: DC
  Filled 2022-11-16: qty 30

## 2022-11-16 MED ORDER — ASPIRIN 81 MG PO CHEW
324.0000 mg | CHEWABLE_TABLET | Freq: Once | ORAL | Status: AC
  Administered 2022-11-16: 324 mg via ORAL
  Filled 2022-11-16: qty 4

## 2022-11-16 NOTE — ED Provider Notes (Signed)
[facility] EMERGENCY DEPT  Provider Note  CSN: [id number] Arrival date & time: 11/16/22 2119  History Chief Complaint  Patient presents with   Chest Pain    Felicia Hayden is a 62 y.o. female with history of cervical radiculopathy followed by [facility] had an MRI this week confirming pinched nerve, awaiting follow up with Dr. Rolena Infante. She reports earlier in the day she was feeling nauseated which she attributed to her pain medication (oxycodone) but she also had a headache and some chest discomfort around midnight. She checked her BP at home and it was elevated. No cardiac history. Seen for similar about 2 weeks ago and had a negative cardiac workup then.    Home Medications Prior to Admission medications   Medication Sig Start Date End Date Taking? Authorizing Provider  acetaminophen (TYLENOL) 325 MG tablet Take 325 mg by mouth every 6 (six) hours as needed.    [provider]  alum & mag hydroxide-simeth (MAALOX/MYLANTA) 200-200-20 MG/5ML suspension Take 30 mLs by mouth every 4 (four) hours as needed for indigestion or heartburn. 08/28/21   Bonnielee Haff, MD  aspirin 81 MG chewable tablet Chew 81 mg by mouth 2 (two) times daily.    [provider]  azelastine (ASTELIN) 0.1 % nasal spray azelastine 137 mcg (0.1 %) nasal spray aerosol  Spray 2 sprays twice a day by intranasal route.    [provider]  azelastine (OPTIVAR) 0.05 % ophthalmic solution Place 1 drop into both eyes as needed. 04/12/18   [provider]  calcium-vitamin D (OSCAL WITH D) 500-200 MG-UNIT per tablet Take 1 tablet by mouth.    [provider]  cholecalciferol (VITAMIN D3) 25 MCG (1000 UNIT) tablet Take 1,000 Units by mouth daily.    [provider]  clotrimazole-betamethasone (LOTRISONE) cream clotrimazole-betamethasone 1 %-0.05 % topical cream  APPLY TOPICALLY TO THE AFFECTED AND SURROUNDING AREAS TWICE DAILY IN THE MORNING AND IN THE  EVENING FOR 7 DAYS    [provider]  Continuous Blood Gluc Sensor (FREESTYLE LIBRE 14 DAY SENSOR) MISC CHANGE SENSOR EVERY 14 DAYS 06/25/21   [provider]  docusate sodium (COLACE) 100 MG capsule Take 100 mg by mouth 2 (two) times daily. 08/04/21   [provider]  famotidine (PEPCID) 20 MG tablet Take 1 tablet (20 mg total) by mouth 2 (two) times daily for 14 days. 08/28/21 09/11/21  Bonnielee Haff, MD  fluticasone (FLONASE) 50 MCG/ACT nasal spray Place 2 sprays into the nose daily.    [provider]  insulin aspart (NOVOLOG FLEXPEN) 100 UNIT/ML FlexPen Inject 25 Units into the skin 3 (three) times daily with meals. And nano pen needles 3/day Patient taking differently: Inject into the skin 3 (three) times daily with meals. And nano pen needles 3/day Insulin is based on sliding scale and carb based per pt 12/02/17   Renato Shin, MD  levocetirizine (XYZAL) 5 MG tablet Take 5 mg by mouth daily. 04/21/21   [provider]  lidocaine (LIDODERM) 5 % Place 1 patch onto the skin daily. Remove & Discard patch within 12 hours or as directed by MD 10/30/22   Fransico Meadow, MD  metoCLOPramide (REGLAN) 5 MG tablet Take 1 tablet (5 mg total) by mouth every 8 (eight) hours as needed for refractory nausea / vomiting. 08/28/21   Bonnielee Haff, MD  ondansetron (ZOFRAN-ODT) 4 MG disintegrating tablet Take 4 mg by mouth every 6 (six) hours as needed. 08/22/21   [provider]  oxyCODONE (ROXICODONE) 5 MG immediate release tablet Take 1 tablet (5 mg total) by mouth every 4 (four) hours as needed for severe pain. 10/30/22   Fransico Meadow, MD  pantoprazole (PROTONIX) 40 MG tablet Take 1 tablet (40 mg total) by mouth daily. 08/29/21 10/28/21  Bonnielee Haff, MD  pseudoephedrine (SUDAFED) 30 MG tablet Take 30 mg by mouth every 4 (four) hours as needed for congestion.     [provider]  Semaglutide-Weight Management (WEGOVY) 2.4 MG/0.75ML SOAJ  Inject 2.4 mg into the skin. PT takes on Monday per pt    [provider]  senna (SENOKOT) 8.6 MG TABS tablet Take 2 tablets by mouth at bedtime. 08/04/21   [provider]  TRESIBA FLEXTOUCH 100 UNIT/ML FlexTouch Pen 20 Units daily. With breakfast meal per pt 01/26/20   [provider]  vitamin C (ASCORBIC ACID) 500 MG tablet Take 500 mg by mouth daily.    [provider]     Allergies    Patient has no known allergies.   Review of Systems   Review of Systems Please see HPI for pertinent positives and negatives  Physical Exam BP (!) 167/80   Pulse (!) 118   Temp 98.3 F (36.8 C)   Resp (!) 25   LMP 12/04/2014 (Exact Date)   SpO2 100%   Physical Exam Vitals and nursing note reviewed.  Constitutional:      Appearance: Normal appearance.  HENT:     Head: Normocephalic and atraumatic.     Nose: Nose normal.     Mouth/Throat:     Mouth: Mucous membranes are moist.  Eyes:     Extraocular Movements: Extraocular movements intact.     Conjunctiva/sclera: Conjunctivae normal.  Cardiovascular:     Rate and Rhythm: Normal rate.  Pulmonary:     Effort: Pulmonary effort is normal.     Breath sounds: Normal breath sounds.  Abdominal:     General: Abdomen is flat.     Palpations: Abdomen is soft.     Tenderness: There is no abdominal tenderness.  Musculoskeletal:        General: No swelling. Normal range of motion.     Cervical back: Neck supple.  Skin:    General: Skin is warm and dry.  Neurological:     General: No focal deficit present.     Mental Status: She is alert.  Psychiatric:        Mood and Affect: Mood normal.     ED Results / Procedures / Treatments   EKG None  Procedures Procedures  Medications Ordered in the ED Medications  oxyCODONE-acetaminophen (PERCOCET/ROXICET) 5-325 MG per tablet 1 tablet (1 tablet Oral Given 11/16/22 0048)  aspirin chewable tablet 324 mg (has no administration in time range)  ondansetron  (ZOFRAN-ODT) disintegrating tablet 4 mg (4 mg Oral Given 11/16/22 0352)  ketorolac (TORADOL) 30 MG/ML injection 30 mg (30 mg Intravenous Given 11/16/22 6789)    Initial Impression and Plan  Patient here for evaluation of chest pain. Has ongoing cervical radicular pain but that is not her primary concern. She was given percocet for pain here with some improvement in triage but still feeling nauseated. Her chest pain is not typical for ACS. She has HTN and DM but no known CAD. She had labs done in triage showing unremarkable CBC, BMP and initial Trop. EKG is unchanged from baseline. I personally viewed the images from radiology studies and agree with radiologist interpretation: CXR is clear.  Will give a dose of zofran for continued nausea. Awaiting repeat trop, anticipate discharge if it remains normal.   ED Course   Clinical Course as of 11/16/22 0627  Tue Nov 16, 2022  0401 Repeat Trop has increased by 9, but remains normal range. Will need to be rechecked a third time for trending. Patient amenable to this plan.  [CS]  0619 Third trop continues to increase. Will plan admission for further evaluation. Her shoulder pain has returned. Toradol ordered. Will also give ASA given increasing Trops.  [CS]  [phone] Spoke with Dr. Aileen Fass, Hospitalist, who will accept for admission.  [CS]    Clinical Course User Index [CS] Truddie Hidden, MD     MDM Rules/Calculators/A&P Medical Decision Making Problems Addressed: Chest pain, unspecified type: acute illness or injury Elevated troponin: acute illness or injury  Amount and/or Complexity of Data Reviewed Labs: ordered. Decision-making details documented in ED Course. Radiology: ordered and independent interpretation performed. Decision-making details documented in ED Course. ECG/medicine tests: ordered and independent interpretation performed. Decision-making details documented in ED Course.  Risk OTC drugs. Prescription drug management. Decision  regarding hospitalization.    Final Clinical Impression(s) / ED Diagnoses Final diagnoses:  Chest pain, unspecified type  Elevated troponin    Rx / DC Orders ED Discharge Orders     None        Truddie Hidden, MD 11/16/22 [phone]

## 2022-11-16 NOTE — ED Notes (Signed)
Pain assessment completed at this time. Pt asking about bed placement and plan of care while waiting on bed. Pt wants to leave and f/u with outpatient and does not want to continue to wait on a bed. MD notified.

## 2022-11-16 NOTE — ED Notes (Signed)
Patient up to the bathroom.   She is crying due to pain in her neck and shoulder.  Will request additional pain medications.

## 2022-11-16 NOTE — ED Notes (Signed)
At bedside, pt having panic attack, HR 130's , RR 43.  Pt reassured.  States she is having pain and nausea.  Messaged MD for additional orders for pain/nausea med.  Will continue to monitor.

## 2022-11-16 NOTE — ED Triage Notes (Signed)
Pain in back of neck hx of pinched nerve. chest pain and sob started around 11:30pm. Reports pain 10/10, crying and uncomfortable in triage

## 2022-11-16 NOTE — ED Provider Notes (Addendum)
  Physical Exam  BP (!) 157/105 (BP Location: Right Wrist)   Pulse 84   Temp 98 F (36.7 C) (Oral)   Resp 11   LMP 12/04/2014 (Exact Date)   SpO2 100%    Patient admitted for cardiac workup, mildly elevated troponin, ongoing chest pain.  Repeat cardiac enzyme had been downtrending and normal.  Low concern for ACS at this time.  Patient is having ongoing back pain in the setting of what she describes as a chronic pinched nerve.  She denies any ongoing chest pain however she has not undergone full inpatient evaluation for her initial presenting symptoms.  Now requesting to leave [place].  Has been admitted and waiting on a bed today.  Informed the patient of the risk of missed diagnosis and potential worsening of the condition which could result in death and disability.  Despite this, the patient still requested to leave [place].      Regan Lemming, MD 11/16/22 1945    Regan Lemming, MD 11/16/22 [phone]

## 2022-11-16 NOTE — ED Notes (Signed)
MD bedside for discussion of AMA.

## 2022-11-16 NOTE — ED Notes (Signed)
Patient is resting.  Family at bedside.  Patient continues to have pain in the left arm.

## 2022-11-16 NOTE — ED Notes (Signed)
Patient is resting.  States the pain medication helped a little, rating her pain 8/10

## 2022-11-16 NOTE — Progress Notes (Signed)
Plan of Care Note for accepted transfer   Patient: Felicia Hayden MRN: [id number]   [place]: 11/16/2022  Facility requesting transfer: [facility]. Requesting Provider: Dr. Karle Starch, [place]. Reason for transfer: Chest pain, rule out ACS.  Facility course: The patient is a 62 year old female with past medical history significant for hypertension, type 2 diabetes, hyperlipidemia, no known history of coronary artery disease, history of cervical radiculopathy followed by [facility], had an MRI done this week to confirm pinched nerve.  She presented with complaints of chest pain with onset several hours ago, the pain is different from her chronic pain.  She was seen in the ED more than 2 weeks ago for similar complaints however workup was negative.  In the ED, on arrival, noted to have uncontrolled hypertension and tachycardia.  First set of high-sensitivity troponin 4, up trended to 19.  Due to risk factors, EDP requested admission for further management.  The patient received a full dose aspirin 324 mg x 1, IV Toradol 30 mg x 1 sublingual 4 mg x 1 and Percocet.  The patient was admitted at [facility] telemetry cardiac unit as observation status.  Plan of care: The patient is accepted for admission to Telemetry unit, at [facility] as observation status.  Author: Kayleen Memos, DO 11/16/2022  Check [email or web address] for on-call coverage.  Nursing staff, Please call [facility] number on Amion as soon as patient's arrival, so appropriate admitting provider can evaluate the pt.

## 2022-11-24 ENCOUNTER — Ambulatory Visit: Payer: BC Managed Care – PPO | Admitting: Internal Medicine

## 2022-11-24 ENCOUNTER — Encounter: Payer: Self-pay | Admitting: Internal Medicine

## 2022-11-24 VITALS — BP 148/72 | HR 93 | Ht 66.0 in | Wt 179.8 lb

## 2022-11-24 DIAGNOSIS — R030 Elevated blood-pressure reading, without diagnosis of hypertension: Secondary | ICD-10-CM

## 2022-11-24 DIAGNOSIS — I2089 Other forms of angina pectoris: Secondary | ICD-10-CM

## 2022-11-24 MED ORDER — METOPROLOL SUCCINATE ER 25 MG PO TB24: 25.0000 mg | ORAL_TABLET | Freq: Every day | ORAL | 3 refills | Status: AC

## 2022-11-24 NOTE — Progress Notes (Signed)
Primary Physician/Referring:  Jonathon Jordan, MD  Patient ID: Felicia Hayden, female    DOB: 07-10-1961, 62 y.o.   MRN: [id number]  Chief Complaint  Patient presents with   Chest Pain   Hospitalization Follow-up   New Patient (Initial Visit)   HPI:    Felicia Hayden  is a 62 y.o. female with past medical history significant for diabetes and hyperlipidemia who is here to establish care after hospital follow-up.  Patient presented to the hospital with chest pain however no underlying reason for this was found.  Patient has never had a stress test or an echocardiogram in the past.  She states that she has been having chest pain when she is exerting herself and this has been really worrying her lately.  Patient denies diaphoresis, syncope, claudication, PND, edema, orthopnea.  Past Medical History:  Diagnosis Date   Abnormal Pap smear    Arthritis    Diabetes mellitus without complication (HCC)    Heart murmur    History of abnormal Pap smear 1978   S/P cryotherapy   Hyperlipidemia    Pinched nerve    Sleep apnea    Past Surgical History:  Procedure Laterality Date   DILATION AND CURETTAGE OF UTERUS  03/16/1999   HIP SURGERY Right    LAPAROSCOPIC GASTRIC BANDING     lasikk eye surgery      TONSILLECTOMY  11/15/1981   Family History  Problem Relation Age of Onset   Hypertension Mother    Cancer Mother        ovarian; uterine   Ovarian cancer Mother    Thyroid disease Mother    Hypothyroidism Mother    Cancer Sister        skin   Cancer Sister        lung   Pneumonia Father    Heart attack Maternal Grandmother    Diabetes Neg Hx    Allergic rhinitis Neg Hx    Angioedema Neg Hx    Asthma Neg Hx    Atopy Neg Hx    Eczema Neg Hx    Immunodeficiency Neg Hx    Urticaria Neg Hx     Social History   Tobacco Use   Smoking status: Former   Smokeless tobacco: Never  Substance Use Topics   Alcohol use: No   Marital Status: Married  ROS  Review of  Systems  Cardiovascular:  Positive for chest pain, dyspnea on exertion, irregular heartbeat and palpitations.   Objective  Blood pressure (!) 148/72, pulse 93, height '5\' 6"'$  (1.676 m), weight 179 lb 12.8 oz (81.6 kg), last menstrual period 12/04/2014, SpO2 98 %. Body mass index is 29.02 kg/m.     11/24/2022   11:52 AM 11/24/2022   11:42 AM 11/16/2022    6:03 PM  Vitals with BMI  Height  '5\' 6"'$    Weight  179 lbs 13 oz   BMI  87.56   Systolic 433 295 188  Diastolic 72 75 416  Pulse 93 92 84     Physical Exam Vitals reviewed.  HENT:     Head: Normocephalic and atraumatic.  Cardiovascular:     Rate and Rhythm: Normal rate and regular rhythm.     Pulses: Normal pulses.     Heart sounds: No murmur heard. Pulmonary:     Breath sounds: Normal breath sounds.  Abdominal:     General: Bowel sounds are normal.  Musculoskeletal:     Right lower leg: No edema.  Left lower leg: No edema.  Skin:    General: Skin is warm and dry.  Neurological:     Mental Status: She is alert.     Medications and allergies  No Known Allergies   Medication list after today's encounter   Current Outpatient Medications:    acetaminophen (TYLENOL) 325 MG tablet, Take 325 mg by mouth every 6 (six) hours as needed., Disp: , Rfl:    azelastine (ASTELIN) 0.1 % nasal spray, azelastine 137 mcg (0.1 %) nasal spray aerosol  Spray 2 sprays twice a day by intranasal route., Disp: , Rfl:    clotrimazole-betamethasone (LOTRISONE) cream, clotrimazole-betamethasone 1 %-0.05 % topical cream  APPLY TOPICALLY TO THE AFFECTED AND SURROUNDING AREAS TWICE DAILY IN THE MORNING AND IN THE EVENING FOR 7 DAYS, Disp: , Rfl:    Continuous Blood Gluc Sensor (FREESTYLE LIBRE 14 DAY SENSOR) MISC, CHANGE SENSOR EVERY 14 DAYS, Disp: , Rfl:    fexofenadine (ALLEGRA) 180 MG tablet, Take 180 mg by mouth daily., Disp: , Rfl:    fluticasone (FLONASE) 50 MCG/ACT nasal spray, Place 2 sprays into the nose daily., Disp: , Rfl:    ibuprofen  (ADVIL) 200 MG tablet, Take 200 mg by mouth every 6 (six) hours as needed., Disp: , Rfl:    insulin aspart (NOVOLOG FLEXPEN) 100 UNIT/ML FlexPen, Inject 25 Units into the skin 3 (three) times daily with meals. And nano pen needles 3/day (Patient taking differently: Inject into the skin 3 (three) times daily with meals. And nano pen needles 3/day Insulin is based on sliding scale and carb based per pt), Disp: 25 pen, Rfl: 3   JARDIANCE 25 MG TABS tablet, Take 25 mg by mouth daily., Disp: , Rfl:    ketotifen (ZADITOR) 0.035 % ophthalmic solution, 1 drop in the morning and at bedtime., Disp: , Rfl:    levocetirizine (XYZAL) 5 MG tablet, Take 5 mg by mouth every evening., Disp: , Rfl:    lidocaine (LIDODERM) 5 %, Place 1 patch onto the skin daily. Remove & Discard patch within 12 hours or as directed by MD, Disp: 7 patch, Rfl: 0   metoprolol succinate (TOPROL XL) 25 MG 24 hr tablet, Take 1 tablet (25 mg total) by mouth daily., Disp: 90 tablet, Rfl: 3   Semaglutide-Weight Management (WEGOVY) 2.4 MG/0.75ML SOAJ, Inject 2.4 mg into the skin. PT takes on Monday per pt, Disp: , Rfl:    TRESIBA FLEXTOUCH 100 UNIT/ML FlexTouch Pen, 20 Units daily. With breakfast meal per pt, Disp: , Rfl:   Laboratory examination:   Lab Results  Component Value Date   NA 138 11/16/2022   K 5.0 11/16/2022   CO2 20 (L) 11/16/2022   GLUCOSE 219 (H) 11/16/2022   BUN 14 11/16/2022   CREATININE 0.81 11/16/2022   CALCIUM 10.7 (H) 11/16/2022   GFRNONAA >60 11/16/2022       Latest Ref Rng & Units 11/16/2022   12:37 AM 10/30/2022    3:42 PM 10/30/2022   11:59 AM  CMP  Glucose 70 - 99 mg/dL 219   214   BUN 8 - 23 mg/dL 14   36   Creatinine 0.44 - 1.00 mg/dL 0.81   0.87   Sodium 135 - 145 mmol/L 138  136  136   Potassium 3.5 - 5.1 mmol/L 5.0  5.0  4.1   Chloride 98 - 111 mmol/L 102   102   CO2 22 - 32 mmol/L 20   20   Calcium 8.9 - 10.3  mg/dL 10.7   9.9   Total Protein 6.5 - 8.1 g/dL   7.7   Total Bilirubin 0.3 - 1.2  mg/dL   0.6   Alkaline Phos 38 - 126 U/L   55   AST 15 - 41 U/L   15   ALT 0 - 44 U/L   14       Latest Ref Rng & Units 11/16/2022    3:00 AM 10/30/2022    3:42 PM 10/30/2022   11:59 AM  CBC  WBC 4.0 - 10.5 K/uL 5.2   9.4   Hemoglobin 12.0 - 15.0 g/dL 13.9  15.3  14.4   Hematocrit 36.0 - 46.0 % 44.5  45.0  45.9   Platelets 150 - 400 K/uL 236   306     Lipid Panel No results for input(s): "CHOL", "TRIG", "M[place]", "VLDL", "HDL", "CHOLHDL", "LDLDIRECT" in the last 8760 hours.  HEMOGLOBIN A1C Lab Results  Component Value Date   HGBA1C 6.2 (H) 08/24/2021   MPG 131.24 08/24/2021   TSH No results for input(s): "TSH" in the last 8760 hours.  External labs:     Radiology:    Cardiac Studies:   PCV ECHOCARDIOGRAM COMPLETE 05/08/2021  Narrative Echocardiogram 05/08/2021: Left ventricle cavity is normal in size. Mild concentric hypertrophy of the left ventricle. Normal global wall motion. Normal LV systolic function with EF 56%. Normal diastolic filling pattern. Mild (Grade I) mitral regurgitation. Mild tricuspid regurgitation. Estimated pulmonary artery systolic pressure 25 mmHg.       EKG:     Assessment     ICD-10-CM   1. Stable angina pectoris  I20.89 PCV ECHOCARDIOGRAM COMPLETE    PCV MYOCARDIAL PERFUSION WO LEXISCAN    2. Elevated blood pressure reading in office without diagnosis of hypertension  R03.0        Orders Placed This Encounter  Procedures   PCV MYOCARDIAL PERFUSION WO LEXISCAN    Standing Status:   Future    Standing Expiration Date:   01/23/2023   PCV ECHOCARDIOGRAM COMPLETE    Standing Status:   Future    Standing Expiration Date:   11/25/2023    Meds ordered this encounter  Medications   metoprolol succinate (TOPROL XL) 25 MG 24 hr tablet    Sig: Take 1 tablet (25 mg total) by mouth daily.    Dispense:  90 tablet    Refill:  3    Medications Discontinued During This Encounter  Medication Reason   alum & mag hydroxide-simeth  (MAALOX/MYLANTA) 200-200-20 MG/5ML suspension Completed Course   methocarbamol (ROBAXIN) 500 MG tablet Completed Course   metoCLOPramide (REGLAN) 5 MG tablet Completed Course   oxyCODONE (ROXICODONE) 5 MG immediate release tablet Completed Course   calcium-vitamin D (OSCAL WITH D) 500-200 MG-UNIT per tablet Completed Course   cholecalciferol (VITAMIN D3) 25 MCG (1000 UNIT) tablet Completed Course   vitamin C (ASCORBIC ACID) 500 MG tablet Completed Course     Recommendations:   Ermine Stebbins is a 62 y.o.  female with angina  Stable angina pectoris Echo and stress test ordered Low dose toprol sent Patient instructed not to do heavy lifting, heavy exertional activity, swimming until evaluation is complete.  Patient instructed to call if symptoms worse or to go to the ED for further evaluation.   Elevated blood pressure reading in office without diagnosis of hypertension Continue current cardiac medications. Encourage low-sodium diet, less than 2000 mg daily. Follow-up in 1-2 months or sooner if needed.  Floydene Flock, DO, [facility]  11/27/2022, 3:52 PM Office: [phone] Pager: [phone]

## 2023-01-04 ENCOUNTER — Ambulatory Visit: Payer: BC Managed Care – PPO

## 2023-01-04 DIAGNOSIS — I2089 Other forms of angina pectoris: Secondary | ICD-10-CM

## 2023-01-06 NOTE — Progress Notes (Signed)
Called and spoke with patient regarding her echocardiogram results.

## 2023-01-07 ENCOUNTER — Encounter: Payer: Self-pay | Admitting: Internal Medicine

## 2023-01-07 NOTE — Telephone Encounter (Signed)
From patient

## 2023-01-17 NOTE — Telephone Encounter (Signed)
From patient

## 2023-01-18 ENCOUNTER — Other Ambulatory Visit: Payer: BC Managed Care – PPO

## 2023-01-18 NOTE — Telephone Encounter (Signed)
yes

## 2023-01-27 ENCOUNTER — Ambulatory Visit: Payer: BC Managed Care – PPO | Admitting: Internal Medicine

## 2023-02-28 ENCOUNTER — Other Ambulatory Visit: Payer: BC Managed Care – PPO

## 2023-03-04 ENCOUNTER — Ambulatory Visit: Payer: BC Managed Care – PPO | Admitting: Internal Medicine

## 2023-09-28 ENCOUNTER — Telehealth: Payer: Self-pay | Admitting: Cardiology

## 2023-09-28 NOTE — Telephone Encounter (Signed)
Spoke with pt regarding a new stress test order. Dr. Odis Hollingshead stated "I have not seen her and if asymptomatic no need to just routinely do stress and echo " after looking at the pt's last office visit note with Felicia Hayden Custovic, DO. Pt stated she has not had any chest pain or any other cardiac symptoms and was agreeable to this plan of care and verbalized understanding to call us if she has any symptoms that come back.

## 2023-09-28 NOTE — Telephone Encounter (Signed)
Patient needs order for myocardial perfusion re-entered so she can be scheduled for the study.  Thank you

## 2023-12-02 LAB — LAB REPORT - SCANNED: EGFR: 21

## 2024-07-23 ENCOUNTER — Other Ambulatory Visit: Payer: Self-pay | Admitting: Obstetrics and Gynecology

## 2024-07-23 DIAGNOSIS — Z1231 Encounter for screening mammogram for malignant neoplasm of breast: Secondary | ICD-10-CM

## 2024-10-01 ENCOUNTER — Ambulatory Visit

## 2024-10-16 ENCOUNTER — Ambulatory Visit
Admission: RE | Admit: 2024-10-16 | Discharge: 2024-10-16 | Disposition: A | Source: Ambulatory Visit | Attending: Obstetrics and Gynecology | Admitting: Obstetrics and Gynecology

## 2024-10-16 DIAGNOSIS — Z1231 Encounter for screening mammogram for malignant neoplasm of breast: Secondary | ICD-10-CM
